# Patient Record
Sex: Female | Born: 1935 | Race: Black or African American | Hispanic: No | Smoking: Never smoker
Health system: Southern US, Community
[De-identification: ages and names within clinical notes are randomized; demographics above are authoritative.]

## PROBLEM LIST (undated history)

## (undated) DIAGNOSIS — I509 Heart failure, unspecified: Secondary | ICD-10-CM

## (undated) DIAGNOSIS — Z9289 Personal history of other medical treatment: Secondary | ICD-10-CM

## (undated) DIAGNOSIS — I2510 Atherosclerotic heart disease of native coronary artery without angina pectoris: Secondary | ICD-10-CM

## (undated) DIAGNOSIS — F329 Major depressive disorder, single episode, unspecified: Secondary | ICD-10-CM

## (undated) DIAGNOSIS — J449 Chronic obstructive pulmonary disease, unspecified: Secondary | ICD-10-CM

## (undated) DIAGNOSIS — C50912 Malignant neoplasm of unspecified site of left female breast: Secondary | ICD-10-CM

## (undated) DIAGNOSIS — T8859XA Other complications of anesthesia, initial encounter: Secondary | ICD-10-CM

## (undated) DIAGNOSIS — C189 Malignant neoplasm of colon, unspecified: Secondary | ICD-10-CM

## (undated) DIAGNOSIS — F32A Depression, unspecified: Secondary | ICD-10-CM

## (undated) DIAGNOSIS — I10 Essential (primary) hypertension: Secondary | ICD-10-CM

## (undated) DIAGNOSIS — J45909 Unspecified asthma, uncomplicated: Secondary | ICD-10-CM

## (undated) DIAGNOSIS — J189 Pneumonia, unspecified organism: Secondary | ICD-10-CM

## (undated) DIAGNOSIS — J42 Unspecified chronic bronchitis: Secondary | ICD-10-CM

## (undated) DIAGNOSIS — G4733 Obstructive sleep apnea (adult) (pediatric): Secondary | ICD-10-CM

## (undated) DIAGNOSIS — D573 Sickle-cell trait: Secondary | ICD-10-CM

## (undated) DIAGNOSIS — I272 Pulmonary hypertension, unspecified: Secondary | ICD-10-CM

## (undated) DIAGNOSIS — T4145XA Adverse effect of unspecified anesthetic, initial encounter: Secondary | ICD-10-CM

## (undated) DIAGNOSIS — E7800 Pure hypercholesterolemia, unspecified: Secondary | ICD-10-CM

## (undated) DIAGNOSIS — F419 Anxiety disorder, unspecified: Secondary | ICD-10-CM

## (undated) DIAGNOSIS — E119 Type 2 diabetes mellitus without complications: Secondary | ICD-10-CM

## (undated) DIAGNOSIS — K219 Gastro-esophageal reflux disease without esophagitis: Secondary | ICD-10-CM

## (undated) DIAGNOSIS — N183 Chronic kidney disease, stage 3 unspecified: Secondary | ICD-10-CM

## (undated) DIAGNOSIS — Z9981 Dependence on supplemental oxygen: Secondary | ICD-10-CM

## (undated) DIAGNOSIS — I739 Peripheral vascular disease, unspecified: Secondary | ICD-10-CM

## (undated) DIAGNOSIS — M1990 Unspecified osteoarthritis, unspecified site: Secondary | ICD-10-CM

## (undated) HISTORY — PX: ABDOMINAL HYSTERECTOMY: SHX81

## (undated) HISTORY — PX: HERNIA REPAIR: SHX51

## (undated) HISTORY — PX: MASTECTOMY: SHX3

## (undated) HISTORY — PX: BREAST BIOPSY: SHX20

## (undated) HISTORY — PX: UMBILICAL HERNIA REPAIR: SHX196

## (undated) HISTORY — PX: COLECTOMY: SHX59

---

## 2013-05-19 HISTORY — PX: CORONARY ANGIOPLASTY WITH STENT PLACEMENT: SHX49

## 2013-05-19 HISTORY — PX: CORONARY ARTERY BYPASS GRAFT: SHX141

## 2013-06-19 DIAGNOSIS — J189 Pneumonia, unspecified organism: Secondary | ICD-10-CM

## 2013-06-19 DIAGNOSIS — F419 Anxiety disorder, unspecified: Secondary | ICD-10-CM

## 2013-06-19 HISTORY — DX: Anxiety disorder, unspecified: F41.9

## 2013-06-19 HISTORY — DX: Pneumonia, unspecified organism: J18.9

## 2013-06-26 ENCOUNTER — Inpatient Hospital Stay
Admission: AD | Admit: 2013-06-26 | Discharge: 2013-08-07 | Disposition: A | Discharge status: Skilled Nursing Facility | Payer: Self-pay | Source: Ambulatory Visit | Attending: Internal Medicine | Admitting: Internal Medicine

## 2013-06-26 DIAGNOSIS — G4733 Obstructive sleep apnea (adult) (pediatric): Secondary | ICD-10-CM

## 2013-06-26 DIAGNOSIS — R6521 Severe sepsis with septic shock: Secondary | ICD-10-CM

## 2013-06-26 DIAGNOSIS — D649 Anemia, unspecified: Secondary | ICD-10-CM

## 2013-06-26 DIAGNOSIS — K920 Hematemesis: Secondary | ICD-10-CM

## 2013-06-26 DIAGNOSIS — Z9911 Dependence on respirator [ventilator] status: Secondary | ICD-10-CM

## 2013-06-26 DIAGNOSIS — R195 Other fecal abnormalities: Secondary | ICD-10-CM

## 2013-06-26 DIAGNOSIS — E662 Morbid (severe) obesity with alveolar hypoventilation: Secondary | ICD-10-CM

## 2013-06-26 DIAGNOSIS — A419 Sepsis, unspecified organism: Secondary | ICD-10-CM

## 2013-06-26 DIAGNOSIS — J90 Pleural effusion, not elsewhere classified: Secondary | ICD-10-CM

## 2013-06-26 DIAGNOSIS — Z930 Tracheostomy status: Secondary | ICD-10-CM

## 2013-06-26 DIAGNOSIS — J9620 Acute and chronic respiratory failure, unspecified whether with hypoxia or hypercapnia: Secondary | ICD-10-CM

## 2013-06-26 HISTORY — DX: Pulmonary hypertension, unspecified: I27.20

## 2013-06-26 HISTORY — DX: Atherosclerotic heart disease of native coronary artery without angina pectoris: I25.10

## 2013-06-26 HISTORY — DX: Chronic obstructive pulmonary disease, unspecified: J44.9

## 2013-06-26 HISTORY — DX: Essential (primary) hypertension: I10

## 2013-06-26 HISTORY — DX: Heart failure, unspecified: I50.9

## 2013-06-26 HISTORY — DX: Peripheral vascular disease, unspecified: I73.9

## 2013-06-26 LAB — BLOOD GAS, ARTERIAL
ACID-BASE EXCESS: 7.5 mmol/L — AB (ref 0.0–2.0)
BICARBONATE: 33.1 meq/L — AB (ref 20.0–24.0)
DELIVERY SYSTEMS: POSITIVE
Expiratory PAP: 10
FIO2: 0.5 %
Inspiratory PAP: 18
O2 Saturation: 98.6 %
PATIENT TEMPERATURE: 98.6
TCO2: 35 mmol/L (ref 0–100)
pCO2 arterial: 62.1 mmHg (ref 35.0–45.0)
pH, Arterial: 7.346 — ABNORMAL LOW (ref 7.350–7.450)
pO2, Arterial: 103 mmHg — ABNORMAL HIGH (ref 80.0–100.0)

## 2013-06-27 ENCOUNTER — Other Ambulatory Visit (HOSPITAL_COMMUNITY): Payer: Self-pay

## 2013-06-27 DIAGNOSIS — E662 Morbid (severe) obesity with alveolar hypoventilation: Secondary | ICD-10-CM | POA: Diagnosis present

## 2013-06-27 DIAGNOSIS — G4733 Obstructive sleep apnea (adult) (pediatric): Secondary | ICD-10-CM | POA: Diagnosis present

## 2013-06-27 DIAGNOSIS — Z9911 Dependence on respirator [ventilator] status: Secondary | ICD-10-CM

## 2013-06-27 DIAGNOSIS — J9620 Acute and chronic respiratory failure, unspecified whether with hypoxia or hypercapnia: Secondary | ICD-10-CM | POA: Diagnosis present

## 2013-06-27 LAB — URINALYSIS, ROUTINE W REFLEX MICROSCOPIC
BILIRUBIN URINE: NEGATIVE
GLUCOSE, UA: NEGATIVE mg/dL
KETONES UR: NEGATIVE mg/dL
Nitrite: NEGATIVE
PH: 5 (ref 5.0–8.0)
Protein, ur: NEGATIVE mg/dL
SPECIFIC GRAVITY, URINE: 1.013 (ref 1.005–1.030)
Urobilinogen, UA: 0.2 mg/dL (ref 0.0–1.0)

## 2013-06-27 LAB — COMPREHENSIVE METABOLIC PANEL
ALK PHOS: 92 U/L (ref 39–117)
ALT: 10 U/L (ref 0–35)
AST: 15 U/L (ref 0–37)
Albumin: 2.5 g/dL — ABNORMAL LOW (ref 3.5–5.2)
BUN: 17 mg/dL (ref 6–23)
CO2: 36 meq/L — AB (ref 19–32)
Calcium: 9.7 mg/dL (ref 8.4–10.5)
Chloride: 108 mEq/L (ref 96–112)
Creatinine, Ser: 1.19 mg/dL — ABNORMAL HIGH (ref 0.50–1.10)
GFR calc Af Amer: 50 mL/min — ABNORMAL LOW (ref >90)
GFR, EST NON AFRICAN AMERICAN: 44 mL/min — AB (ref >90)
GLUCOSE: 155 mg/dL — AB (ref 70–99)
POTASSIUM: 4.9 meq/L (ref 3.7–5.3)
SODIUM: 151 meq/L — AB (ref 137–147)
Total Bilirubin: 0.5 mg/dL (ref 0.3–1.2)
Total Protein: 6.1 g/dL (ref 6.0–8.3)

## 2013-06-27 LAB — BLOOD GAS, ARTERIAL
ACID-BASE EXCESS: 8.4 mmol/L — AB (ref 0.0–2.0)
ACID-BASE EXCESS: 10 mmol/L — AB (ref 0.0–2.0)
Acid-Base Excess: 10.6 mmol/L — ABNORMAL HIGH (ref 0.0–2.0)
Bicarbonate: 34.3 mEq/L — ABNORMAL HIGH (ref 20.0–24.0)
Bicarbonate: 36.3 mEq/L — ABNORMAL HIGH (ref 20.0–24.0)
Bicarbonate: 34.5 mEq/L — ABNORMAL HIGH (ref 20.0–24.0)
DELIVERY SYSTEMS: POSITIVE
Expiratory PAP: 10
FIO2: 0.5 %
FIO2: 0.7 %
FIO2: 60 %
INSPIRATORY PAP: 18
LHR: 18 {breaths}/min
LHR: 22 {breaths}/min
MECHVT: 430 mL
O2 SAT: 98.1 %
O2 Saturation: 99.2 %
O2 Saturation: 100 %
PATIENT TEMPERATURE: 98.6
PCO2 ART: 44.1 mmHg (ref 35.0–45.0)
PEEP/CPAP: 5 cmH2O
PEEP/CPAP: 5 cmH2O
PH ART: 7.314 — AB (ref 7.350–7.450)
PIP: 0 cmH2O
Patient temperature: 98.6
Patient temperature: 98.6
Pressure control: 30 cmH2O
TCO2: 36.4 mmol/L (ref 0–100)
TCO2: 38.5 mmol/L (ref 0–100)
TCO2: 35.8 mmol/L (ref 0–100)
pCO2 arterial: 67.1 mmHg (ref 35.0–45.0)
pCO2 arterial: 73.6 mmHg (ref 35.0–45.0)
pH, Arterial: 7.329 — ABNORMAL LOW (ref 7.350–7.450)
pH, Arterial: 7.504 — ABNORMAL HIGH (ref 7.350–7.450)
pO2, Arterial: 104 mmHg — ABNORMAL HIGH (ref 80.0–100.0)
pO2, Arterial: 129 mmHg — ABNORMAL HIGH (ref 80.0–100.0)
pO2, Arterial: 212 mmHg — ABNORMAL HIGH (ref 80.0–100.0)

## 2013-06-27 LAB — CBC WITH DIFFERENTIAL/PLATELET
Basophils Absolute: 0 10*3/uL (ref 0.0–0.1)
Basophils Relative: 0 % (ref 0–1)
EOS PCT: 1 % (ref 0–5)
Eosinophils Absolute: 0.1 10*3/uL (ref 0.0–0.7)
HCT: 25.5 % — ABNORMAL LOW (ref 36.0–46.0)
Hemoglobin: 7.7 g/dL — ABNORMAL LOW (ref 12.0–15.0)
LYMPHS ABS: 0.7 10*3/uL (ref 0.7–4.0)
Lymphocytes Relative: 8 % — ABNORMAL LOW (ref 12–46)
MCH: 28.3 pg (ref 26.0–34.0)
MCHC: 30.2 g/dL (ref 30.0–36.0)
MCV: 93.8 fL (ref 78.0–100.0)
Monocytes Absolute: 0.8 10*3/uL (ref 0.1–1.0)
Monocytes Relative: 9 % (ref 3–12)
Neutro Abs: 7.3 10*3/uL (ref 1.7–7.7)
Neutrophils Relative %: 81 % — ABNORMAL HIGH (ref 43–77)
PLATELETS: 273 10*3/uL (ref 150–400)
RBC: 2.72 MIL/uL — AB (ref 3.87–5.11)
RDW: 14.8 % (ref 11.5–15.5)
WBC: 9 10*3/uL (ref 4.0–10.5)

## 2013-06-27 LAB — URINE MICROSCOPIC-ADD ON

## 2013-06-27 LAB — PREALBUMIN: Prealbumin: 9.8 mg/dL — ABNORMAL LOW (ref 17.0–34.0)

## 2013-06-27 LAB — PHOSPHORUS: Phosphorus: 2.8 mg/dL (ref 2.3–4.6)

## 2013-06-27 LAB — HEMOGLOBIN A1C
HEMOGLOBIN A1C: 5.9 % — AB (ref <5.7)
Mean Plasma Glucose: 123 mg/dL — ABNORMAL HIGH (ref <117)

## 2013-06-27 LAB — VITAMIN B12: Vitamin B-12: 358 pg/mL (ref 211–911)

## 2013-06-27 LAB — PRO B NATRIURETIC PEPTIDE: Pro B Natriuretic peptide (BNP): 2234 pg/mL — ABNORMAL HIGH (ref 0–450)

## 2013-06-27 LAB — MAGNESIUM: Magnesium: 2.3 mg/dL (ref 1.5–2.5)

## 2013-06-27 LAB — FOLATE RBC: RBC Folate: 996 ng/mL — ABNORMAL HIGH (ref >280)

## 2013-06-27 LAB — T4, FREE: FREE T4: 1.27 ng/dL (ref 0.80–1.80)

## 2013-06-27 LAB — TSH: TSH: 3.252 u[IU]/mL (ref 0.350–4.500)

## 2013-06-27 NOTE — Procedures (Signed)
Intubation Procedure Note Levenia Skalicky [id number] May 17, 1936  Procedure: Intubation Indications: Respiratory insufficiency  Procedure Details Consent: Risks of procedure as well as the alternatives and risks of each were explained to the (patient/caregiver).  Consent for procedure obtained. Time Out: Verified patient identification, verified procedure, site/side was marked, verified correct patient position, special equipment/implants available, medications/allergies/relevent history reviewed, required imaging and test results available.  Performed  Maximum sterile technique was used including gloves, hand hygiene and mask.  MAC and 3  Given 2 mg versed, 100 mcg fentanyl, 10 mg etomidate.  Inserted 7.5 ETT using glidescope.  Evaluation Hemodynamic Status: BP stable throughout; O2 sats: stable throughout Patient's Current Condition: stable Complications: No apparent complications Patient did tolerate procedure well. Chest X-ray ordered to verify placement.  CXR: pending.   Performed by Marni Griffon, ACNP.  I was present during procedure.  Chesley Mires, MD [facility] Pulmonary/Critical Care 06/27/2013, 12:36 PM Pager:  [phone] After 3pm call: [phone]

## 2013-06-27 NOTE — Procedures (Deleted)
Intubation Procedure Note Lori Burnett [id number] 1936/03/30  Procedure: Intubation Indications: Respiratory insufficiency  Procedure Details Consent: Risks of procedure as well as the alternatives and risks of each were explained to the (patient/caregiver).  Consent for procedure obtained. Time Out: Verified patient identification, verified procedure, site/side was marked, verified correct patient position, special equipment/implants available, medications/allergies/relevent history reviewed, required imaging and test results available.  Performed  Maximum sterile technique was used including antiseptics, cap, gloves, hand hygiene and mask.  MAC 3 glide scope     Evaluation Hemodynamic Status: BP stable throughout; O2 sats: stable throughout Patient's Current Condition: stable Complications: No apparent complications Patient did tolerate procedure well. Chest X-ray ordered to verify placement.  CXR: pending.   BABCOCK,PETE 06/27/2013

## 2013-06-27 NOTE — Consult Note (Signed)
Name: Lori Burnett MRN: [id number] DOB: 03/25/1936    ADMISSION DATE:  06/26/2013 CONSULTATION DATE: 1/9  REFERRING MD :  Laren Everts PRIMARY SERVICE:  [facility] CHIEF COMPLAINT:  Respiratory failure   BRIEF PATIENT DESCRIPTION:  [facility] artery bypass grafting on 12/22. Postop course notable for difficult wean, atrial fibrillation,UTI progressive deconditioning, and almost continuous BiPAP dependence. Transferred to [facility] on 1/9 for further supportive care.  SIGNIFICANT EVENTS / STUDIES:  CABG 12/22   LINES / TUBES:   CULTURES:   ANTIBIOTICS: Levaquin 1/7>>>>  HISTORY OF PRESENT ILLNESS:   This is a Lori Burnett with a complex medical history of obesity, obstructive sleep apnea, congestive heart failure, chronic dyspnea, and heart disease, admitted to [facility] on 12/17 with acute MI. Ultimately underwent coronary artery bypass grafting x3 vessels on 12/22. Hospital course was prolonged, and complicated by postoperative anemia requiring blood transfusion, difficulty with ventilator weaning, urinary tract infection, and postop atrial fibrillation requiring amiodarone and Cardizem. She was eventually extubated, however post extubation course required almost continuous BiPAP support. With notes from outside hospital noting almost immediate desaturation when positive pressure was removed. [facility] in order to receive further rehabilitation efforts, and positive pressure support.  PAST MEDICAL HISTORY :  Heart disease, hypertension, chronic dyspnea, breast cancer, with prior left mastectomy, peripheral artery disease, history of pulmonary artery hypertension with cor pulmonale, congestive heart failure, RV dilation, obstructive sleep apnea, "COPD", colectomy, colon cancer, gouty  arthritis, diabetes type 2 with poor control, history of pulmonary edema. Acute diagnoses Profound deconditioning, acute on chronic respiratory failure, hypernatremia, diabetes, status post CABG, cor pulmonale, hypertension, pulmonary artery hypertension( secondary and pregnancy Prior to Admission medications   Lipitor 40 mg by mouth daily, Azopt 1% ophthalmic drops, ferrous sulfate 325 by mouth daily Niaspan 500 mg daily Norvasc 10 mg daily loratadine 10 mg daily Paxil 30 mg daily Singulair 10 mg daily DuoNeb 4 times a day, Plavix 75 mg daily Cordarone 200 mg twice a day Lopressor 50 mg twice a day Cardizem 30 mg 4 times a day Prinivil 5 mg daily Colace 100 mg twice a day Zantac 150 mg daily liver mirror Flexaphen 16 units each bedtime NovoLog sliding scale insulin TriCor 160 mg daily Pulmicort neb 0.5 mg twice a day Levaquin 500 mg by mouth every 24 hours started 1/7   Allergies not on file No known drug allergies FAMILY HISTORY:  No family history on file. SOCIAL HISTORY:  has no tobacco, alcohol, and drug history on file.  REVIEW OF SYSTEMS:   Unable   SUBJECTIVE:  Labored respiratory efforts on noninvasive support VITAL SIGNS:    PHYSICAL EXAMINATION: General:  Lori obese African American Burnett currently on iPAP Neuro:  Awake, oriented, profoundly weak HEENT:  Neck is large, unable to appreciate jugular venous distention Cardiovascular:  distant Lungs:  diminished Abdomen:  Obese and without organomegaly Musculoskeletal:  Intact Skin:  intact   Recent Labs Lab 06/27/13 0535  NA 151*  K 4.9  CL 108  CO2 36*  BUN 17  CREATININE 1.19*  GLUCOSE 155*    Recent Labs Lab 06/27/13 0535  HGB 7.7*  HCT 25.5*  WBC 9.0  PLT 273   Dg Chest Port 1 View  06/27/2013   CLINICAL DATA:  COPD, respiratory failure  EXAM: PORTABLE CHEST - 1 VIEW  COMPARISON:  None.  FINDINGS: Right IJ and right subclavian central venous catheters are present. The right IJ catheter terminates  in the mid SVC in the right subclavian catheter projects over the distal SVC. Enlargement of the cardiac and mediastinal contours favored to reflect a combination of cardiomegaly, vascular congestion and portable frontal technique. Patient is status post median sternotomy with evidence of multivessel CABG including LIMA bypass. There is pulmonary vascular congestion with a moderate pulmonary edema. Bilateral layering pleural effusions and associated bibasilar opacities favored to reflect pleural fluid with atelectasis.  IMPRESSION: 1. Overall findings are most consistent with moderate CHF. 2. Enlargement of the cardiopericardial silhouette may reflect cardiomegaly and/ pericardial effusion. 3. Bilateral interstitial and airspace opacities favored to reflect moderate pulmonary edema. 4. Bibasilar opacities likely reflecting layering pleural effusions and atelectasis. In the appropriate clinical setting, superimposed infection is difficult to exclude entirely. 5. Right IJ and subclavian central venous catheters with their tips projecting over the mid and lower SVC respectively.   Electronically Signed   By: Jacqulynn Cadet M.D.   On: 06/27/2013 07:59    ASSESSMENT / PLAN: Acute on chronic respiratory failure in the setting of what appears to be multifactorial: Obesity hypoventilation syndrome, obstructive sleep apnea, profound deconditioning, and atelectasis. Doubtful that she even has obstructive lung disease. She has been requiring some level of continuous positive pressure either via ventilator, or BiPAP, since 12/22. Her blood gas and physical exam suggests he is failing these efforts Recommendation -Intubation, mechanical ventilation -Most likely would benefit from tracheostomy -Following tracheostomy, reinitiate weaning efforts, currently appears like she would benefit from nocturnal ventilation even after acute issues have resolved  Reported history of cardiomyopathy, recent acute MI, and status  post CABG on 12/22. History of what is likely secondary pulmonary artery hypertension and cor pulmonale > No echocardiogram results available Atrial fibrillation hypertension Recommendation Would repeat echocardiogram Continue supportive medical care  Hypernatremia And CRI Recommendation plan Supplement free water, would hold diuresis today.  Urinary tract infection Plan Per IM service  Anemia of critical illness Plan Per IM service.  Diabetes Plan Per IM. Service  Profound deconditioning Plan PT OT efforts  [place] Pager: [phone]  06/27/2013, 10:50 AM  Reviewed above, examined pt, and agree with assessment/plan.  She has difficult course after recent CABG.  She has OSA/OHS and CHF.  She has been BiPAP dependent for past several days.  She will likely need intubation, and then tracheostomy with prolonged respiratory support.  CC time Lori minutes.  Chesley Mires, MD [facility] Pulmonary/Critical Care 06/27/2013, 12:03 PM Pager:  [phone] After 3pm call: [phone]

## 2013-06-28 ENCOUNTER — Other Ambulatory Visit (HOSPITAL_COMMUNITY): Payer: Self-pay

## 2013-06-28 LAB — CBC
HCT: 27.1 % — ABNORMAL LOW (ref 36.0–46.0)
HEMOGLOBIN: 8.4 g/dL — AB (ref 12.0–15.0)
MCH: 28.9 pg (ref 26.0–34.0)
MCHC: 31 g/dL (ref 30.0–36.0)
MCV: 93.1 fL (ref 78.0–100.0)
Platelets: 304 10*3/uL (ref 150–400)
RBC: 2.91 MIL/uL — ABNORMAL LOW (ref 3.87–5.11)
RDW: 14.8 % (ref 11.5–15.5)
WBC: 15.5 10*3/uL — ABNORMAL HIGH (ref 4.0–10.5)

## 2013-06-28 LAB — BASIC METABOLIC PANEL
BUN: 29 mg/dL — ABNORMAL HIGH (ref 6–23)
CHLORIDE: 103 meq/L (ref 96–112)
CO2: 33 mEq/L — ABNORMAL HIGH (ref 19–32)
CREATININE: 1.23 mg/dL — AB (ref 0.50–1.10)
Calcium: 9.5 mg/dL (ref 8.4–10.5)
GFR, EST AFRICAN AMERICAN: 48 mL/min — AB (ref >90)
GFR, EST NON AFRICAN AMERICAN: 42 mL/min — AB (ref >90)
Glucose, Bld: 247 mg/dL — ABNORMAL HIGH (ref 70–99)
POTASSIUM: 4.5 meq/L (ref 3.7–5.3)
Sodium: 150 mEq/L — ABNORMAL HIGH (ref 137–147)

## 2013-06-29 LAB — BASIC METABOLIC PANEL
BUN: 39 mg/dL — AB (ref 6–23)
CALCIUM: 8.9 mg/dL (ref 8.4–10.5)
CHLORIDE: 104 meq/L (ref 96–112)
CO2: 31 mEq/L (ref 19–32)
CREATININE: 1.76 mg/dL — AB (ref 0.50–1.10)
GFR calc Af Amer: 31 mL/min — ABNORMAL LOW (ref >90)
GFR, EST NON AFRICAN AMERICAN: 27 mL/min — AB (ref >90)
Glucose, Bld: 244 mg/dL — ABNORMAL HIGH (ref 70–99)
Potassium: 4.3 mEq/L (ref 3.7–5.3)
Sodium: 146 mEq/L (ref 137–147)

## 2013-06-29 LAB — URINE CULTURE

## 2013-06-29 LAB — CBC
HEMATOCRIT: 20.2 % — AB (ref 36.0–46.0)
Hemoglobin: 6.4 g/dL — CL (ref 12.0–15.0)
MCH: 28.8 pg (ref 26.0–34.0)
MCHC: 31.7 g/dL (ref 30.0–36.0)
MCV: 91 fL (ref 78.0–100.0)
PLATELETS: 250 10*3/uL (ref 150–400)
RBC: 2.22 MIL/uL — ABNORMAL LOW (ref 3.87–5.11)
RDW: 15.1 % (ref 11.5–15.5)
WBC: 13.7 10*3/uL — ABNORMAL HIGH (ref 4.0–10.5)

## 2013-06-29 LAB — ABO/RH: ABO/RH(D): O POS

## 2013-06-29 LAB — PREPARE RBC (CROSSMATCH)

## 2013-06-30 ENCOUNTER — Other Ambulatory Visit (HOSPITAL_COMMUNITY): Payer: Self-pay

## 2013-06-30 LAB — BLOOD GAS, ARTERIAL
ACID-BASE EXCESS: 9.7 mmol/L — AB (ref 0.0–2.0)
Bicarbonate: 33.8 mEq/L — ABNORMAL HIGH (ref 20.0–24.0)
FIO2: 0.4 %
LHR: 22 {breaths}/min
O2 Saturation: 99.4 %
PEEP: 5 cmH2O
PO2 ART: 131 mmHg — AB (ref 80.0–100.0)
Patient temperature: 98.6
TCO2: 35.3 mmol/L (ref 0–100)
VT: 430 mL
pCO2 arterial: 46.7 mmHg — ABNORMAL HIGH (ref 35.0–45.0)
pH, Arterial: 7.473 — ABNORMAL HIGH (ref 7.350–7.450)

## 2013-06-30 LAB — CBC
HCT: 26.5 % — ABNORMAL LOW (ref 36.0–46.0)
HEMOGLOBIN: 9 g/dL — AB (ref 12.0–15.0)
MCH: 29.3 pg (ref 26.0–34.0)
MCHC: 34 g/dL (ref 30.0–36.0)
MCV: 86.3 fL (ref 78.0–100.0)
Platelets: 211 10*3/uL (ref 150–400)
RBC: 3.07 MIL/uL — AB (ref 3.87–5.11)
RDW: 15.3 % (ref 11.5–15.5)
WBC: 12.4 10*3/uL — ABNORMAL HIGH (ref 4.0–10.5)

## 2013-06-30 LAB — BASIC METABOLIC PANEL
BUN: 41 mg/dL — ABNORMAL HIGH (ref 6–23)
CO2: 32 mEq/L (ref 19–32)
Calcium: 8.9 mg/dL (ref 8.4–10.5)
Chloride: 97 mEq/L (ref 96–112)
Creatinine, Ser: 1.72 mg/dL — ABNORMAL HIGH (ref 0.50–1.10)
GFR, EST AFRICAN AMERICAN: 32 mL/min — AB (ref >90)
GFR, EST NON AFRICAN AMERICAN: 28 mL/min — AB (ref >90)
Glucose, Bld: 205 mg/dL — ABNORMAL HIGH (ref 70–99)
POTASSIUM: 3.6 meq/L — AB (ref 3.7–5.3)
SODIUM: 142 meq/L (ref 137–147)

## 2013-06-30 NOTE — Consult Note (Signed)
   Name: Lori Burnett MRN: [id number] DOB: October 04, 1935    ADMISSION DATE:  06/26/2013 CONSULTATION DATE: 1/9  REFERRING MD :  Laren Everts PRIMARY SERVICE:  [facility] CHIEF COMPLAINT:  Respiratory failure   BRIEF PATIENT DESCRIPTION:  [name] [facility] post coronary artery bypass grafting on 12/22. Postop course notable for difficult wean, atrial fibrillation,UTI progressive deconditioning, and almost continuous BiPAP dependence. Transferred to [facility] on 1/9 for further supportive care.  SIGNIFICANT EVENTS / STUDIES:  CABG 12/22   LINES / TUBES:   CULTURES:   ANTIBIOTICS: Levaquin 1/7>>>>  SUBJECTIVE:  Sedated on vent   VITAL SIGNS:    PHYSICAL EXAMINATION: General:  Morbidly obese African American female currently sedated on vent  Neuro:  Awake, oriented, profoundly weak HEENT:  Neck is large, unable to appreciate jugular venous distention, short neck Cardiovascular:  distant Lungs:  diminished Abdomen:  Obese and without organomegaly Musculoskeletal:  Intact Skin:  intact   Recent Labs Lab 06/28/13 0438 06/29/13 0505 06/30/13 0305  NA 150* 146 142  K 4.5 4.3 3.6*  CL 103 104 97  CO2 33* 31 32  BUN 29* 39* 41*  CREATININE 1.23* 1.76* 1.72*  GLUCOSE 247* 244* 205*    Recent Labs Lab 06/28/13 0438 06/29/13 0505 06/30/13 0305  HGB 8.4* 6.4* 9.0*  HCT 27.1* 20.2* 26.5*  WBC 15.5* 13.7* 12.4*  PLT 304 250 211   Dg Chest Port 1 View  06/30/2013   CLINICAL DATA:  Respiratory failure.  EXAM: PORTABLE CHEST - 1 VIEW  COMPARISON:  06/28/2013  FINDINGS: Endotracheal tube tip is approximately 2.5 cm above the carina. Right-sided Port-A-Cath and central line positioning are stable. Lungs show similar edema pattern. Aeration at both lung bases is slightly improved. This may be the result of less prominent pleural effusions bilaterally. There is stable cardiomegaly.  IMPRESSION:  Stable edema. Improved aeration at the lung bases with potentially decrease in size of bilateral pleural effusions.   Electronically Signed   By: Aletta Edouard M.D.   On: 06/30/2013 07:50    ASSESSMENT / PLAN: Acute on chronic respiratory failure in the setting of what appears to be multifactorial: Obesity hypoventilation syndrome, obstructive sleep apnea, profound deconditioning, and atelectasis. Doubtful that she even has obstructive lung disease. Intubated 1/9. Continue full vent support.   Recommendation -cont  mechanical ventilation -needs ENT consult for trach, not a bedside candidate -Following tracheostomy, reinitiate weaning efforts, currently appears like she would benefit from nocturnal ventilation even after acute issues have resolved -abg now, ensure not alkaotic still and would need reduction MV PS attempts high  Reported history of cardiomyopathy, recent acute MI, and status post CABG on 12/22. History of what is likely secondary pulmonary artery hypertension and cor pulmonale > No echocardiogram results available Atrial fibrillation hypertension Recommendation Would repeat echocardiogram Continue supportive medical care  Hypernatremia And CRI Recommendation plan Supplement free water, would hold diuresis today.   [place] Pager: [phone]  06/30/2013, 1:46 PM  Ccm time 30 min   Lavon Paganini. Titus Mould, MD, [place] Pgr: [place] Pulmonary & Critical Care

## 2013-07-01 LAB — CULTURE, RESPIRATORY

## 2013-07-01 LAB — CULTURE, RESPIRATORY W GRAM STAIN

## 2013-07-01 LAB — VANCOMYCIN, TROUGH: Vancomycin Tr: 7.7 ug/mL — ABNORMAL LOW (ref 10.0–20.0)

## 2013-07-02 ENCOUNTER — Other Ambulatory Visit (HOSPITAL_COMMUNITY): Payer: Self-pay

## 2013-07-02 LAB — TYPE AND SCREEN
ABO/RH(D): O POS
Antibody Screen: NEGATIVE
UNIT DIVISION: 0
UNIT DIVISION: 0
Unit division: 0
Unit division: 0

## 2013-07-02 LAB — CBC
HCT: 23.3 % — ABNORMAL LOW (ref 36.0–46.0)
HEMOGLOBIN: 7.6 g/dL — AB (ref 12.0–15.0)
MCH: 28.7 pg (ref 26.0–34.0)
MCHC: 32.6 g/dL (ref 30.0–36.0)
MCV: 87.9 fL (ref 78.0–100.0)
PLATELETS: 222 10*3/uL (ref 150–400)
RBC: 2.65 MIL/uL — AB (ref 3.87–5.11)
RDW: 15.3 % (ref 11.5–15.5)
WBC: 11.6 10*3/uL — ABNORMAL HIGH (ref 4.0–10.5)

## 2013-07-02 LAB — BASIC METABOLIC PANEL
BUN: 46 mg/dL — ABNORMAL HIGH (ref 6–23)
CALCIUM: 8.8 mg/dL (ref 8.4–10.5)
CHLORIDE: 94 meq/L — AB (ref 96–112)
CO2: 34 meq/L — AB (ref 19–32)
Creatinine, Ser: 1.5 mg/dL — ABNORMAL HIGH (ref 0.50–1.10)
GFR calc Af Amer: 38 mL/min — ABNORMAL LOW (ref >90)
GFR calc non Af Amer: 33 mL/min — ABNORMAL LOW (ref >90)
GLUCOSE: 214 mg/dL — AB (ref 70–99)
Potassium: 3.7 mEq/L (ref 3.7–5.3)
Sodium: 140 mEq/L (ref 137–147)

## 2013-07-02 LAB — PRO B NATRIURETIC PEPTIDE: PRO B NATRI PEPTIDE: 1139 pg/mL — AB (ref 0–450)

## 2013-07-02 LAB — PROTIME-INR
INR: 1.21 (ref 0.00–1.49)
Prothrombin Time: 15 seconds (ref 11.6–15.2)

## 2013-07-02 LAB — APTT: aPTT: 42 seconds — ABNORMAL HIGH (ref 24–37)

## 2013-07-02 LAB — PREPARE RBC (CROSSMATCH)

## 2013-07-02 NOTE — H&P (Signed)
PREOPERATIVE H&P  Chief Complaint: respiratory failure  HPI: Lori Burnett is a 78 y.o. female who presents for evaluation of respiratory failure. She has a history of COPD, OSA and CHF. She underwent CABG x 3 in December and post operatively she had a lot of respiratory difficulty requiring intubation a week ago. She's expected to have a prolonged weaning and extubation course and was recommended tracheostomy but is not a candidate for bedside tracheostomy. I was subsequently consulted for tracheostomy in the OR. She's obese and has significant OSA.  No past medical history on file. No past surgical history on file. History   Social History  . Marital Status: Divorced    Spouse Name: N/A    Number of Children: N/A  . Years of Education: N/A   Social History Main Topics  . Smoking status: Not on file  . Smokeless tobacco: Not on file  . Alcohol Use: Not on file  . Drug Use: Not on file  . Sexual Activity: Not on file   Other Topics Concern  . Not on file   Social History Narrative  . No narrative on file   No family history on file. Allergies not on file Prior to Admission medications   Medication Sig Start Date End Date Taking? Authorizing Provider  atenolol (TENORMIN) 100 MG tablet Take 100 mg by mouth daily.   Yes Historical Provider, MD  atorvastatin (LIPITOR) 20 MG tablet Take 20 mg by mouth daily.   Yes Historical Provider, MD  Choline Fenofibrate 135 MG capsule Take 135 mg by mouth daily.   Yes Historical Provider, MD  cloNIDine (CATAPRES) 0.1 MG tablet Take 0.1 mg by mouth.   Yes Historical Provider, MD  fluticasone (FLONASE) 50 MCG/ACT nasal spray Place into both nostrils daily.   Yes Historical Provider, MD  insulin aspart (NOVOLOG) 100 UNIT/ML injection Inject into the skin 3 (three) times daily before meals.   Yes Historical Provider, MD  insulin glargine (LANTUS) 100 UNIT/ML injection Inject into the skin at bedtime.   Yes Historical Provider, MD  montelukast  (SINGULAIR) 10 MG tablet Take 10 mg by mouth at bedtime.   Yes Historical Provider, MD  nitroGLYCERIN (NITROSTAT) 0.4 MG SL tablet Place 0.4 mg under the tongue every 5 (five) minutes as needed for chest pain.   Yes Historical Provider, MD  omeprazole (PRILOSEC) 20 MG capsule Take 20 mg by mouth daily.   Yes Historical Provider, MD  PARoxetine (PAXIL) 40 MG tablet Take 40 mg by mouth every morning.   Yes Historical Provider, MD  potassium chloride SA (K-DUR,KLOR-CON) 20 MEQ tablet Take 20 mEq by mouth daily.   Yes Historical Provider, MD     Positive ROS: negative  All other systems have been reviewed and were otherwise negative with the exception of those mentioned in the HPI and as above.  Physical Exam: There were no vitals filed for this visit.  General: Intubated and sedated. Oral: Intubated with G tube. Nasal: Clear nasal passages Neck: No palpable adenopathy or thyroid nodules. Trach midline. Ear: Ear canal is clear with normal appearing TMs Cardiovascular: Regular rate and rhythm, no murmur. Vertical chest incision. Respiratory: Clear to auscultation Neurologic: Alert and oriented x 3   Assessment/Plan: respiratory failure Plan for Procedure(s): TRACHEOSTOMY   Melony Overly, MD 07/02/2013 10:15 AM

## 2013-07-02 NOTE — Consult Note (Signed)
NAME:  Lori Burnett, Lori Burnett                      ACCOUNT NO.:  MEDICAL RECORD NO.:  LOCATION:                                 FACILITY:  PHYSICIAN:  Jaelani Posa E. Lucia Gaskins, M.D. DATE OF BIRTH:  DATE OF CONSULTATION:  07/01/2013 DATE OF DISCHARGE:                                CONSULTATION   REASON FOR CONSULT:  Evaluate patient for tracheotomy.  BRIEF HISTORY:  Yamaira Burnett is an obese 78 year old female, who recently underwent coronary artery bypass surgery x3 on December 22nd, because of abnormalities found on the stress test and catheterization.  She has a long history of obstructive sleep apnea and congestive heart failure as well as COPD.  Postoperatively, she had a lot of breathing difficulty, requiring BiPAP.  She was subsequently transferred to specialty hospital last week and intubated because of her breathing difficulty.  Pulmonary specialty care feels like she is going to have a long course for extubation as she has bad obstructive sleep apnea as well as COPD, is very obese.  She was not a candidate for bedside tracheostomy, and I was consulted for tracheostomy in the OR.  PHYSICAL EXAMINATION:  The patient is intubated.  She is very obese, has a short neck.  Trachea is palpable in midline with no real masses.  I discussed the tracheostomy with Lori Burnett's daughters and they are agreeable to this.  We will plan on scheduling this later in the week.          ______________________________ Leonides Sake. Lucia Gaskins, M.D.     CEN/MEDQ  D:  07/01/2013  T:  07/02/2013  Job:  [id number]

## 2013-07-02 NOTE — Progress Notes (Signed)
    Name: Lori Burnett MRN: [id number] DOB: 1936-05-20    ADMISSION DATE:  06/26/2013 CONSULTATION DATE: 1/9  REFERRING MD :  Laren Everts PRIMARY SERVICE:  [facility] CHIEF COMPLAINT:  Respiratory failure   BRIEF PATIENT DESCRIPTION:  [name] [facility] post coronary artery bypass grafting on 12/22. Postop course notable for difficult wean, atrial fibrillation,UTI progressive deconditioning, and almost continuous BiPAP dependence. Transferred to [facility] on 1/9 for further supportive care.  SIGNIFICANT EVENTS / STUDIES:  CABG 12/22  LINES / TUBES: oett 1/10>>>  CULTURES:  ANTIBIOTICS: Levaquin 1/7>>>>  SUBJECTIVE:  Sedated on vent   VITAL SIGNS: reviewed, see chart    PHYSICAL EXAMINATION: General:  Morbidly obese African American female currently sedated on vent  Neuro:  Awake, oriented, profoundly weak HEENT:  Neck is large, unable to appreciate jugular venous distention, short neck Cardiovascular:  distant Lungs:  diminished Abdomen:  Obese and without organomegaly Musculoskeletal:  Intact Skin:  intact   Recent Labs Lab 06/29/13 0505 06/30/13 0305 07/02/13 0528  NA 146 142 140  K 4.3 3.6* 3.7  CL 104 97 94*  CO2 31 32 34*  BUN 39* 41* 46*  CREATININE 1.76* 1.72* 1.50*  GLUCOSE 244* 205* 214*    Recent Labs Lab 06/29/13 0505 06/30/13 0305 07/02/13 0528  HGB 6.4* 9.0* 7.6*  HCT 20.2* 26.5* 23.3*  WBC 13.7* 12.4* 11.6*  PLT 250 211 222   No results found.  ASSESSMENT / PLAN: Acute on chronic respiratory failure in the setting of what appears to be multifactorial: Obesity hypoventilation syndrome, obstructive sleep apnea, profound deconditioning, and atelectasis.  Doubtful that she even has obstructive lung disease. Intubated 1/9. Continue full vent support.  Trach planned for 1/15 Recommendation -cont  mechanical ventilation -needs ENT consult for trach, not a  bedside candidate -Following tracheostomy, reinitiate weaning efforts, currently appears like she would benefit from nocturnal ventilation even after acute issues have resolved PS attempts to 12 Would repeat pcxr  Reported history of cardiomyopathy, recent acute MI, and status post CABG on 12/22. History of what is likely secondary pulmonary artery hypertension and cor pulmonale > No echocardiogram results available Atrial fibrillation hypertension Recommendation Would repeat echocardiogram Continue supportive medical care  Hypernatremia And CRI Recommendation plan Supplement free water, would hold diuresis today again . Renal fxn improving  Anemia. hgb variable.  Plan: Per # service   [employer] Pager: [phone]  07/02/2013, 10:23 AM  I have fully examined this patient and agree with above findings.    And edited infull  Lavon Paganini. Titus Mould, MD, [place] Pgr: [place] [facility] & Critical [facility]

## 2013-07-03 ENCOUNTER — Encounter: Payer: Self-pay | Admitting: Anesthesiology

## 2013-07-03 ENCOUNTER — Encounter
Admission: AD | Disposition: A | Discharge status: Skilled Nursing Facility | Payer: Self-pay | Source: Ambulatory Visit | Attending: Internal Medicine

## 2013-07-03 ENCOUNTER — Encounter (HOSPITAL_COMMUNITY): Payer: Self-pay | Admitting: Anesthesiology

## 2013-07-03 ENCOUNTER — Ambulatory Visit (HOSPITAL_COMMUNITY): Payer: Self-pay | Admitting: Anesthesiology

## 2013-07-03 HISTORY — PX: TRACHEOSTOMY TUBE PLACEMENT: SHX814

## 2013-07-03 LAB — CBC WITH DIFFERENTIAL/PLATELET
Basophils Absolute: 0 10*3/uL (ref 0.0–0.1)
Basophils Relative: 0 % (ref 0–1)
Eosinophils Absolute: 0.7 10*3/uL (ref 0.0–0.7)
Eosinophils Relative: 6 % — ABNORMAL HIGH (ref 0–5)
HCT: 26.6 % — ABNORMAL LOW (ref 36.0–46.0)
HEMOGLOBIN: 8.8 g/dL — AB (ref 12.0–15.0)
LYMPHS PCT: 7 % — AB (ref 12–46)
Lymphs Abs: 0.8 10*3/uL (ref 0.7–4.0)
MCH: 28.3 pg (ref 26.0–34.0)
MCHC: 33.1 g/dL (ref 30.0–36.0)
MCV: 85.5 fL (ref 78.0–100.0)
MONOS PCT: 13 % — AB (ref 3–12)
Monocytes Absolute: 1.5 10*3/uL — ABNORMAL HIGH (ref 0.1–1.0)
NEUTROS ABS: 8.5 10*3/uL — AB (ref 1.7–7.7)
Neutrophils Relative %: 74 % (ref 43–77)
Platelets: 213 10*3/uL (ref 150–400)
RBC: 3.11 MIL/uL — ABNORMAL LOW (ref 3.87–5.11)
RDW: 15.6 % — ABNORMAL HIGH (ref 11.5–15.5)
WBC: 11.4 10*3/uL — AB (ref 4.0–10.5)

## 2013-07-03 LAB — OCCULT BLOOD X 1 CARD TO LAB, STOOL: Fecal Occult Bld: POSITIVE — AB

## 2013-07-03 SURGERY — CREATION, TRACHEOSTOMY
Anesthesia: General | Site: Neck

## 2013-07-03 MED ORDER — NOREPINEPHRINE BITARTRATE 1 MG/ML IJ SOLN
4000.0000 ug | INTRAVENOUS | Status: DC | PRN
  Administered 2013-07-03: 15.4 ug/min via INTRAVENOUS

## 2013-07-03 MED ORDER — LACTATED RINGERS IV SOLN
INTRAVENOUS | Status: DC | PRN
  Administered 2013-07-03: 08:00:00 via INTRAVENOUS

## 2013-07-03 MED ORDER — LIDOCAINE-EPINEPHRINE 1 %-1:100000 IJ SOLN
INTRAMUSCULAR | Status: DC | PRN
  Administered 2013-07-03: 20 mL

## 2013-07-03 MED ORDER — LIDOCAINE HCL 4 % MT SOLN
OROMUCOSAL | Status: DC | PRN
  Administered 2013-07-03: 4 mL via TOPICAL

## 2013-07-03 MED ORDER — 0.9 % SODIUM CHLORIDE (POUR BTL) OPTIME
TOPICAL | Status: DC | PRN
  Administered 2013-07-03: 1000 mL

## 2013-07-03 MED ORDER — PROPRANOLOL HCL 1 MG/ML IV SOLN
INTRAVENOUS | Status: DC | PRN
  Administered 2013-07-03 (×2): .25 mg via INTRAVENOUS

## 2013-07-03 MED ORDER — ROCURONIUM BROMIDE 100 MG/10ML IV SOLN
INTRAVENOUS | Status: DC | PRN
  Administered 2013-07-03: 20 mg via INTRAVENOUS

## 2013-07-03 MED ORDER — PROPOFOL INFUSION 10 MG/ML OPTIME
INTRAVENOUS | Status: DC | PRN
  Administered 2013-07-03: 5 ug/kg/min via INTRAVENOUS

## 2013-07-03 MED ORDER — FENTANYL CITRATE 0.05 MG/ML IJ SOLN
INTRAMUSCULAR | Status: DC | PRN
  Administered 2013-07-03 (×3): 50 ug via INTRAVENOUS
  Administered 2013-07-03: 100 ug via INTRAVENOUS

## 2013-07-03 SURGICAL SUPPLY — 43 items
ATTRACTOMAT 16X20 MAGNETIC DRP (DRAPES) IMPLANT
BLADE SURG 15 STRL LF DISP TIS (BLADE) ×1 IMPLANT
BLADE SURG 15 STRL SS (BLADE) ×2
CLEANER TIP ELECTROSURG 2X2 (MISCELLANEOUS) ×3 IMPLANT
CLOTH BEACON ORANGE TIMEOUT ST (SAFETY) ×3 IMPLANT
COVER SURGICAL LIGHT HANDLE (MISCELLANEOUS) ×3 IMPLANT
ELECT COATED BLADE 2.86 ST (ELECTRODE) ×3 IMPLANT
ELECT REM PT RETURN 9FT ADLT (ELECTROSURGICAL) ×3
ELECTRODE REM PT RTRN 9FT ADLT (ELECTROSURGICAL) ×1 IMPLANT
GAUZE SPONGE 4X4 16PLY XRAY LF (GAUZE/BANDAGES/DRESSINGS) ×3 IMPLANT
GLOVE BIOGEL PI IND STRL 7.0 (GLOVE) ×1 IMPLANT
GLOVE BIOGEL PI INDICATOR 7.0 (GLOVE) ×2
GLOVE SS BIOGEL STRL SZ 7.5 (GLOVE) ×2 IMPLANT
GLOVE SUPERSENSE BIOGEL SZ 7.5 (GLOVE) ×4
GLOVE SURG SS PI 7.0 STRL IVOR (GLOVE) ×3 IMPLANT
GOWN PREVENTION PLUS XLARGE (GOWN DISPOSABLE) ×3 IMPLANT
GOWN STRL NON-REIN LRG LVL3 (GOWN DISPOSABLE) ×3 IMPLANT
HOLDER TRACH TUBE VELCRO 19.5 (MISCELLANEOUS) ×3 IMPLANT
KIT BASIN OR (CUSTOM PROCEDURE TRAY) ×3 IMPLANT
KIT ROOM TURNOVER OR (KITS) ×3 IMPLANT
KIT SUCTION CATH 14FR (SUCTIONS) ×3 IMPLANT
NEEDLE HYPO 25X1 1.5 SAFETY (NEEDLE) ×3 IMPLANT
NS IRRIG 1000ML POUR BTL (IV SOLUTION) ×3 IMPLANT
PACK EENT II TURBAN DRAPE (CUSTOM PROCEDURE TRAY) ×3 IMPLANT
PAD ARMBOARD 7.5X6 YLW CONV (MISCELLANEOUS) ×6 IMPLANT
PENCIL BUTTON HOLSTER BLD 10FT (ELECTRODE) ×3 IMPLANT
SPONGE DRAIN TRACH 4X4 STRL 2S (GAUZE/BANDAGES/DRESSINGS) ×3 IMPLANT
SPONGE GAUZE 4X4 12PLY STER LF (GAUZE/BANDAGES/DRESSINGS) ×3 IMPLANT
SPONGE INTESTINAL PEANUT (DISPOSABLE) IMPLANT
SURGILUBE 2OZ TUBE FLIPTOP (MISCELLANEOUS) ×3 IMPLANT
SUT SILK 2 0 SH CR/8 (SUTURE) ×3 IMPLANT
SUT SILK 3 0 TIES 10X30 (SUTURE) IMPLANT
SYR 20CC LL (SYRINGE) ×3 IMPLANT
SYR CONTROL 10ML LL (SYRINGE) ×3 IMPLANT
TOWEL OR 17X24 6PK STRL BLUE (TOWEL DISPOSABLE) ×6 IMPLANT
TOWEL OR 17X26 10 PK STRL BLUE (TOWEL DISPOSABLE) ×3 IMPLANT
TUBE CONNECTING 12'X1/4 (SUCTIONS) ×1
TUBE CONNECTING 12X1/4 (SUCTIONS) ×2 IMPLANT
TUBE TRACH 6.0 EXL PROX CUF (TUBING) ×3 IMPLANT
TUBE TRACH SHILEY  6 DIST  CUF (TUBING) IMPLANT
TUBE TRACH SHILEY 10 DIST CUFF (TUBING) IMPLANT
TUBE TRACH SHILEY 8 DIST CUF (TUBING) IMPLANT
WATER STERILE IRR 1000ML POUR (IV SOLUTION) ×3 IMPLANT

## 2013-07-03 NOTE — Transfer of Care (Signed)
Immediate Anesthesia Transfer of Care Note  Patient: Lori Burnett  Procedure(s) Performed: Procedure(s): TRACHEOSTOMY (N/A)  Patient Location: Nursing Unit  Anesthesia Type:General  Level of Consciousness: Patient remains intubated per anesthesia plan  Airway & Oxygen Therapy: Patient remains intubated per anesthesia plan and Patient placed on Ventilator (see vital sign flow sheet for setting)  Post-op Assessment: Report given to PACU RN  Post vital signs: Reviewed and stable  Complications: No apparent anesthesia complications

## 2013-07-03 NOTE — Anesthesia Postprocedure Evaluation (Signed)
  Anesthesia Post-op Note  Patient: Lori Burnett  Procedure(s) Performed: Procedure(s): TRACHEOSTOMY (N/A)  Patient Location: Nursing Unit  Anesthesia Type:General  Level of Consciousness: Patient remains intubated per anesthesia plan  Airway and Oxygen Therapy: Patient placed on Ventilator (see vital sign flow sheet for setting) and connected to trachostomy  Post-op Pain: none  Post-op Assessment: Post-op Vital signs reviewed  Post-op Vital Signs: Reviewed and stable  Complications: No apparent anesthesia complications

## 2013-07-03 NOTE — Brief Op Note (Signed)
06/26/2013 - 07/03/2013  8:50 AM  PATIENT:  Lori Burnett  78 y.o. female  PRE-OPERATIVE DIAGNOSIS:  Respiratory failure  POST-OPERATIVE DIAGNOSIS:  Respiratory failure  PROCEDURE:  Procedure(s): TRACHEOSTOMY (N/A) Shiley 6 XLTCP  SURGEON:  Surgeon(s) and Role:    * Rozetta Nunnery, MD - Primary  PHYSICIAN ASSISTANT:   ASSISTANTS: none   ANESTHESIA:   general  EBL:     BLOOD ADMINISTERED:none  DRAINS: none   LOCAL MEDICATIONS USED:  XYLOCAINE with EPI 4cc  SPECIMEN:  No Specimen  DISPOSITION OF SPECIMEN:  N/A  COUNTS:  YES  TOURNIQUET:  * No tourniquets in log *  DICTATION: Viviann Spare Dictation Dictation [phone]  PLAN OF CARE: Discharge to home after PACU  PATIENT DISPOSITION:  PACU - hemodynamically stable.   Delay start of Pharmacological VTE agent (>24hrs) due to surgical blood loss or risk of bleeding: not applicable

## 2013-07-03 NOTE — Anesthesia Postprocedure Evaluation (Signed)
  Anesthesia Post-op Note  Patient: Lori Burnett  Procedure(s) Performed: Procedure(s): TRACHEOSTOMY (N/A)  Patient Location: ICU  Anesthesia Type:General  Level of Consciousness: sedated, patient cooperative and Patient remains intubated per anesthesia plan  Airway and Oxygen Therapy: Patient Spontanous Breathing  Post-op Pain: none  Post-op Assessment: Post-op Vital signs reviewed, Patient's Cardiovascular Status Stable, Respiratory Function Stable, Patent Airway, No signs of Nausea or vomiting and Pain level controlled  Post-op Vital Signs: stable  Complications: No apparent anesthesia complications

## 2013-07-03 NOTE — Anesthesia Preprocedure Evaluation (Signed)
Anesthesia Evaluation  Patient identified by MRN, date of birth, ID band Patient awake    Reviewed: Allergy & Precautions, H&P , NPO status , Patient's Chart, lab work & pertinent test results  Airway       Dental   Pulmonary sleep apnea ,          Cardiovascular     Neuro/Psych    GI/Hepatic   Endo/Other  Morbid obesity  Renal/GU      Musculoskeletal   Abdominal   Peds  Hematology   Anesthesia Other Findings   Reproductive/Obstetrics                           Anesthesia Physical Anesthesia Plan  ASA: III  Anesthesia Plan: General   Post-op Pain Management:    Induction: Intravenous  Airway Management Planned: Mask  Additional Equipment:   Intra-op Plan:   Post-operative Plan:   Informed Consent:   Plan Discussed with:   Anesthesia Plan Comments:         Anesthesia Quick Evaluation

## 2013-07-04 ENCOUNTER — Encounter (HOSPITAL_COMMUNITY): Payer: Self-pay | Admitting: Otolaryngology

## 2013-07-04 DIAGNOSIS — R6521 Severe sepsis with septic shock: Secondary | ICD-10-CM

## 2013-07-04 DIAGNOSIS — I4891 Unspecified atrial fibrillation: Secondary | ICD-10-CM

## 2013-07-04 DIAGNOSIS — A419 Sepsis, unspecified organism: Secondary | ICD-10-CM

## 2013-07-04 LAB — CULTURE, BLOOD (ROUTINE X 2)
CULTURE: NO GROWTH
Culture: NO GROWTH

## 2013-07-04 LAB — TYPE AND SCREEN
ABO/RH(D): O POS
Antibody Screen: NEGATIVE
UNIT DIVISION: 0
Unit division: 0

## 2013-07-04 LAB — OCCULT BLOOD X 1 CARD TO LAB, STOOL: FECAL OCCULT BLD: POSITIVE — AB

## 2013-07-04 LAB — PROCALCITONIN: PROCALCITONIN: 0.6 ng/mL

## 2013-07-04 NOTE — Op Note (Unsigned)
NAME:  Lori Burnett, Lori Burnett                      ACCOUNT NO.:  MEDICAL RECORD NO.:  [id number]  LOCATION:                                 FACILITY:  PHYSICIAN:  Christopher E. Lucia Gaskins, M.D. DATE OF BIRTH:  DATE OF PROCEDURE:  07/03/2013 DATE OF DISCHARGE:                              OPERATIVE REPORT   PREOPERATIVE DIAGNOSES:  Respiratory failure with history of chronic obstructive pulmonary disease and obstructive sleep apnea.  POSTOPERATIVE DIAGNOSES:  Respiratory failure with history of chronic obstructive pulmonary disease and obstructive sleep apnea.  OPERATION PERFORMED:  Via tracheostomy with a #6 Shiley XLT CP tracheostomy.  SURGEON:  Melony Overly, M.D.  ANESTHESIA:  General endotracheal.  COMPLICATIONS:  None.  ESTIMATED BLOOD LOSS:  Minimal.  BRIEF CLINICAL NOTE:  Lori Burnett is a 78 year old female with a long history of COPD, congestive heart failure, and obstructive sleep apnea. She underwent a coronary artery bypass surgery in mid December and postoperatively has significant respiratory problems on BiPAP.  She was subsequently transferred to [facility], requiring intubation.  Respiratory felt like she would have a prolonged intubation with a very slow wean because of her significant history of COPD, obstructive sleep apnea and significant obesity.  They were unable to perform a bedside tracheostomy as she has a very short neck and obesity. I was subsequently consulted to perform a tracheostomy in the OR.  DESCRIPTION OF PROCEDURE:  After adequate endotracheal anesthesia, the patient was positioned and neck extended.  A vertical incision was made just midline just below the cricoid cartilage.  Dissection was carried out through a significant amount of subcutaneous fat.  Strap muscles were divided in midline and in the cricoid and the first 2-3 tracheal rings were identified.  At this point, the thyroid isthmus in the midline.  I performed a  horizontal tracheotomy between the first and second tracheal ring, and the endotracheal tube was removed in a #6 Shiley XLT CP trach was positioned without difficulty.  There was minimal bleeding. The trach was secured with 4-0, 2-0 silk sutures and Velcro around the neck.  She was ventilated well, and the patient was subsequently transferred back to [facility].          ______________________________ Leonides Sake. Lucia Gaskins, M.D.     CEN/MEDQ  D:  07/03/2013  T:  07/04/2013  Job:  [id number]

## 2013-07-04 NOTE — Progress Notes (Signed)
  Echocardiogram 2D Echocardiogram has been performed.  Mauricio Po 07/04/2013, 5:51 PM

## 2013-07-04 NOTE — Progress Notes (Signed)
Name: Lori Burnett MRN: [id number] DOB: 09/24/1935    ADMISSION DATE:  06/26/2013 CONSULTATION DATE: 1/9  REFERRING MD :  Lori Burnett PRIMARY SERVICE:  [facility] CHIEF COMPLAINT:  Respiratory failure   BRIEF PATIENT DESCRIPTION:  [name] [facility] post coronary artery bypass grafting on 12/22. Postop course notable for difficult wean, atrial fibrillation,UTI progressive deconditioning, and almost continuous BiPAP dependence. Transferred to [facility] on 1/9 for further supportive care.  SIGNIFICANT EVENTS / STUDIES:  CABG 12/22 1/15 trach  LINES / TUBES: oett 1/10>>>1/151/15 trach(neuman)>>  CULTURES: 1-9 uc>>ecoli>>ss roc ANTIBIOTICS: Levaquin 1/7>>>>stopped Roc 1-14>> SUBJECTIVE:  Follows commands despite sedation  VITAL SIGNS: Vital signs reviewed. Abnormal values will appear under impression plan section.     PHYSICAL EXAMINATION: General:  Morbidly obese African American female currently sedated on vent  Neuro:  Awake, oriented, profoundly weak HEENT:  Neck is large, unable to appreciate jugular venous distention, short neck. Trach #6 intact Cardiovascular:  distant Lungs:  diminished Abdomen:  Obese and without organomegaly Musculoskeletal:  Intact Skin:  intact   Recent Labs Lab 06/29/13 0505 06/30/13 0305 07/02/13 0528  NA 146 142 140  K 4.3 3.6* 3.7  CL 104 97 94*  CO2 31 32 34*  BUN 39* 41* 46*  CREATININE 1.76* 1.72* 1.50*  GLUCOSE 244* 205* 214*    Recent Labs Lab 06/30/13 0305 07/02/13 0528 07/03/13 0915  HGB 9.0* 7.6* 8.8*  HCT 26.5* 23.3* 26.6*  WBC 12.4* 11.6* 11.4*  PLT 211 222 213   Dg Chest Port 1 View  07/02/2013   CLINICAL DATA:  78 year old female intubated. Initial encounter.  EXAM: PORTABLE CHEST - 1 VIEW  COMPARISON:  DG CHEST 1V PORT dated 06/30/2013  FINDINGS: Portable AP semi upright view at I 1410 hrs. Endotracheal tube tip just below the  level the clavicles. Right chest porta cath re- identified. Stable right subclavian approach central venous catheter. Enteric tube courses to the left upper quadrant, tip not included.  Stable cardiac size and mediastinal contours. Sequelae of CABG. Mild bilateral veiling pulmonary opacity. Pulmonary vascularity appears increased. No pneumothorax. Retrocardiac opacity mildly increased.  IMPRESSION: 1.  Stable lines and tubes. 2. Mildly increased interstitial opacity with small bilateral pleural effusions, suggesting worsening interstitial edema. 3. Increased retrocardiac atelectasis or consolidation.   Electronically Signed   By: Lori Burnett M.D.   On: 07/02/2013 14:24    ASSESSMENT / PLAN: Acute on chronic respiratory failure in the setting of what appears to be multifactorial: Obesity hypoventilation syndrome, obstructive sleep apnea, profound deconditioning, and atelectasis.  Doubtful that she even has obstructive lung disease. Intubated 1/9. Continue full vent support.  Trach  1/15 Recommendation -cont  mechanical ventilation, Wean per protocol -Following tracheostomy, reinitiate weaning efforts, currently appears like she would benefit from nocturnal ventilation even after acute issues have resolved    Reported history of cardiomyopathy, recent acute MI, and status post CABG on 12/22. History of what is likely secondary pulmonary artery hypertension and cor pulmonale Pressor dependent on levophed for days> is this related to pulmonary hypertension? > No echocardiogram results available Atrial fibrillation hypertension Recommendation Wean pressors as tolerated.  Move BP cuff Check procalcitonin Check 2 d Check cortisol level Check lactic acid Consider cardiology consult> RHC  Continue supportive medical care  Hypernatremia And CRI Lab Results  Component Value Date   CREATININE 1.50* 07/02/2013   CREATININE 1.72* 06/30/2013   CREATININE 1.76* 06/29/2013    Recent  Labs Lab  06/29/13 0505 06/30/13 0305 07/02/13 0528  NA 146 142 140    Recommendation plan Supplement free water, would hold diuresis  Renal fxn improving  Anemia. hgb variable.  Plan: Per # service   [name] ACNP [name] PCCM Pager [phone] till 3 pm If no answer page [phone] 07/04/2013, 11:25 AM  Attending:  I have seen and examined the patient with nurse practitioner/resident and agree with the note above.   This lady has been on pressors for days and doesn't look particularly septic on exam.  I question whether or not this is cardiogenic shock (consider PH related).  Agree with above, echo, cardiology consult, cortisol, lactic acid  Would continue weaning protocol, oxygen requirements are minimal and she does not appear to be in resp distress  [name] PCCM Pager: [phone] Cell: [phone] If no response, call [phone]

## 2013-07-05 LAB — LACTIC ACID, PLASMA: Lactic Acid, Venous: 1.3 mmol/L (ref 0.5–2.2)

## 2013-07-05 LAB — BASIC METABOLIC PANEL
BUN: 61 mg/dL — ABNORMAL HIGH (ref 6–23)
CO2: 31 mEq/L (ref 19–32)
Calcium: 8.8 mg/dL (ref 8.4–10.5)
Chloride: 93 mEq/L — ABNORMAL LOW (ref 96–112)
Creatinine, Ser: 1.51 mg/dL — ABNORMAL HIGH (ref 0.50–1.10)
GFR calc Af Amer: 38 mL/min — ABNORMAL LOW (ref >90)
GFR calc non Af Amer: 33 mL/min — ABNORMAL LOW (ref >90)
GLUCOSE: 285 mg/dL — AB (ref 70–99)
POTASSIUM: 4.1 meq/L (ref 3.7–5.3)
Sodium: 138 mEq/L (ref 137–147)

## 2013-07-05 LAB — CBC
HCT: 19.7 % — ABNORMAL LOW (ref 36.0–46.0)
HEMOGLOBIN: 6.5 g/dL — AB (ref 12.0–15.0)
MCH: 28.9 pg (ref 26.0–34.0)
MCHC: 33 g/dL (ref 30.0–36.0)
MCV: 87.6 fL (ref 78.0–100.0)
PLATELETS: 234 10*3/uL (ref 150–400)
RBC: 2.25 MIL/uL — AB (ref 3.87–5.11)
RDW: 15.6 % — ABNORMAL HIGH (ref 11.5–15.5)
WBC: 10.5 10*3/uL (ref 4.0–10.5)

## 2013-07-05 LAB — CORTISOL
CORTISOL PLASMA: 23.2 ug/dL
Cortisol, Plasma: 19.1 ug/dL

## 2013-07-05 LAB — PREPARE RBC (CROSSMATCH)

## 2013-07-06 ENCOUNTER — Other Ambulatory Visit (HOSPITAL_COMMUNITY): Payer: Self-pay

## 2013-07-06 DIAGNOSIS — K920 Hematemesis: Secondary | ICD-10-CM

## 2013-07-06 DIAGNOSIS — D649 Anemia, unspecified: Secondary | ICD-10-CM

## 2013-07-06 DIAGNOSIS — R195 Other fecal abnormalities: Secondary | ICD-10-CM

## 2013-07-06 LAB — BASIC METABOLIC PANEL
BUN: 60 mg/dL — AB (ref 6–23)
CALCIUM: 8.7 mg/dL (ref 8.4–10.5)
CO2: 31 meq/L (ref 19–32)
Chloride: 91 mEq/L — ABNORMAL LOW (ref 96–112)
Creatinine, Ser: 1.52 mg/dL — ABNORMAL HIGH (ref 0.50–1.10)
GFR calc Af Amer: 38 mL/min — ABNORMAL LOW (ref >90)
GFR calc non Af Amer: 32 mL/min — ABNORMAL LOW (ref >90)
GLUCOSE: 351 mg/dL — AB (ref 70–99)
Potassium: 4.4 mEq/L (ref 3.7–5.3)
SODIUM: 136 meq/L — AB (ref 137–147)

## 2013-07-06 LAB — CBC
HEMATOCRIT: 24.7 % — AB (ref 36.0–46.0)
Hemoglobin: 8.2 g/dL — ABNORMAL LOW (ref 12.0–15.0)
MCH: 29.7 pg (ref 26.0–34.0)
MCHC: 33.2 g/dL (ref 30.0–36.0)
MCV: 89.5 fL (ref 78.0–100.0)
PLATELETS: 288 10*3/uL (ref 150–400)
RBC: 2.76 MIL/uL — ABNORMAL LOW (ref 3.87–5.11)
RDW: 15.4 % (ref 11.5–15.5)
WBC: 13.6 10*3/uL — AB (ref 4.0–10.5)

## 2013-07-06 LAB — HEPATIC FUNCTION PANEL
ALK PHOS: 78 U/L (ref 39–117)
ALT: 8 U/L (ref 0–35)
AST: 17 U/L (ref 0–37)
Albumin: 2.2 g/dL — ABNORMAL LOW (ref 3.5–5.2)
BILIRUBIN INDIRECT: 0.2 mg/dL — AB (ref 0.3–0.9)
BILIRUBIN TOTAL: 0.6 mg/dL (ref 0.3–1.2)
Bilirubin, Direct: 0.4 mg/dL — ABNORMAL HIGH (ref 0.0–0.3)
TOTAL PROTEIN: 5.7 g/dL — AB (ref 6.0–8.3)

## 2013-07-06 LAB — LIPASE, BLOOD: Lipase: 31 U/L (ref 11–59)

## 2013-07-06 LAB — PROTIME-INR
INR: 1.15 (ref 0.00–1.49)
Prothrombin Time: 14.5 seconds (ref 11.6–15.2)

## 2013-07-06 LAB — AMYLASE: Amylase: 49 U/L (ref 0–105)

## 2013-07-06 LAB — PROCALCITONIN: PROCALCITONIN: 0.61 ng/mL

## 2013-07-06 LAB — APTT: aPTT: 31 seconds (ref 24–37)

## 2013-07-06 NOTE — Consult Note (Addendum)
Referring Provider: [employer] Primary Care Physician:  No PCP Per Patient Primary Gastroenterologist:  None   Reason for Consultation:  Coffee ground Emesis  HPI: Lori Burnett is a 78 y.o. female  Ad[facility] to [facility] 06[facility]/13, after undergoing CABG on 06/09/13. Complicated post op course with acute on chronic respiratory failure felt multifactorial, and unable to wean. She has since had trach and remains on vent. On Plavix post CABG. Has been tolerating NG feedings until yesterday when had a couple episodes of emesis which were noted to be dark. Tube feeds palced on hold and NG hooked to suction . Has had about 700 cc coffee ground material overnight. No stools or melena today. Pt is alert but difficult to assess c/o nausea or abdominal pain. Shakes head yes to hx of ulcers. Pt does have hx of breast cancer-s/p left mastectomy, and hx of colon cancer s/p resection. She lives in [place]- no records here. Plavix stopped yesterday. Had 2 units of blood last pm for hgb 6.5-pending this am She had had HGb in the 8-9 range past couple weeks.   No past medical history on file.  Past Surgical History  Procedure Laterality Date  . Tracheostomy tube placement N/A 07/03/2013    Procedure: TRACHEOSTOMY;  Surgeon: Rozetta Nunnery, MD;  Location: [facility];  Service: ENT;  Laterality: N/A;    Prior to Admission medications   Medication Sig Start Date End Date Taking? Authorizing Provider  atenolol (TENORMIN) 100 MG tablet Take 100 mg by mouth daily.   Yes Historical Provider, MD  atorvastatin (LIPITOR) 20 MG tablet Take 20 mg by mouth daily.   Yes Historical Provider, MD  Choline Fenofibrate 135 MG capsule Take 135 mg by mouth daily.   Yes Historical Provider, MD  cloNIDine (CATAPRES) 0.1 MG tablet Take 0.1 mg by mouth.   Yes Historical Provider, MD  fluticasone (FLONASE) 50 MCG/ACT nasal spray Place into both nostrils daily.   Yes Historical Provider, MD  insulin aspart (NOVOLOG)  100 UNIT/ML injection Inject into the skin 3 (three) times daily before meals.   Yes Historical Provider, MD  insulin glargine (LANTUS) 100 UNIT/ML injection Inject into the skin at bedtime.   Yes Historical Provider, MD  montelukast (SINGULAIR) 10 MG tablet Take 10 mg by mouth at bedtime.   Yes Historical Provider, MD  nitroGLYCERIN (NITROSTAT) 0.4 MG SL tablet Place 0.4 mg under the tongue every 5 (five) minutes as needed for chest pain.   Yes Historical Provider, MD  omeprazole (PRILOSEC) 20 MG capsule Take 20 mg by mouth daily.   Yes Historical Provider, MD  PARoxetine (PAXIL) 40 MG tablet Take 40 mg by mouth every morning.   Yes Historical Provider, MD  potassium chloride SA (K-DUR,KLOR-CON) 20 MEQ tablet Take 20 mEq by mouth daily.   Yes Historical Provider, MD    No current facility-administered medications for this encounter.    Allergies as of 06/26/2013  . (Not on File)    No family history on file.  History   Social History  . Marital Status: Divorced    Spouse Name: N/A    Number of Children: N/A  . Years of Education: N/A   Occupational History  . Not on file.   Social History Main Topics  . Smoking status: Not on file  . Smokeless tobacco: Not on file  . Alcohol Use: Not on file  . Drug Use: Not on file  . Sexual Activity: Not on file   Other  Topics Concern  . Not on file   Social History Narrative  . No narrative on file    Review of Systems: .Pertinent positive and negative review of systems were noted in the above HPI section.  All other review of systems was otherwise negative.  Physical Exam: Vital signs in last 24 hours:     General:   Alert,  Well-developed, morbidly obese,AA female,alert-trached Eyes:  Sclera clear, no icterus.   Conjunctiva pink. Ears:  Normal auditory acuity. Nose:  No deformity, discharge,  or lesions.NG draining coffee ground material Mouth:  No deformity or lesions.   Neck:  trach. Lungs:  Coarse BS   Heart:  Regular  rate and rhythm; no murmurs, clicks, rubs,  or gallops.s/p left mastectomy Abdomen:large,obese,BS + midline scar, slightly distended, nontender Rectal:  Deferred - no melena per nursing, stool heme + Msk:  Symmetrical without gross deformities. . Pulses:  Normal pulses noted. Extremities:  Without clubbing or edema. Neurologic:  Alert  Skin:  Intact without significant lesions or rashes.. Psych:  Alert and cooperative. Intake/Output from previous day:   Intake/Output this shift:    Lab Results:  Recent Labs  07/05/13 0630  WBC 10.5  HGB 6.5*  HCT 19.7*  PLT 234   BMET  Recent Labs  07/05/13 0630  NA 138  K 4.1  CL 93*  CO2 31  GLUCOSE 285*  BUN 61*  CREATININE 1.51*  CALCIUM 8.8     IMPRESSION:  #1 78 yo morbidly obese female S/P CABG on 06/09/2013, and unable to wean secondary to chronic respiratory failure-multifactorial-now S/P trach and remaining on vent #2 chronic antiplatelet therpay post CABG-on plavix #3 hx breast CA #4 hx of colon CA s/p resection #5 DM #6 Acute/subacute GI bleed with coffee ground emesis x 24 hours- suspect ulcerative esophagitis vs PUD #7 Anemia -acute blood loss   PLAN:  #1 PPI BID IV #2 transfuse to keep hgb > 8 #3 stop Plavix  #4 Leave NG to suction, and hold TF #5 Will schedule for EGD tomorrow-probably in OR because of vent-with Dr. Olevia Perches.   Amy Esterwood  07/06/2013, 10:03 AM     Attending physician's note   I have taken a history, reviewed the chart and examined the patient. I agree with the Advanced Practitioner's note, impression and recommendations. Respiratory failure following CABG on 12/22 with trach on vent. Acute/subacute GI bleeding with coffee ground emesis yesterday and today has coffee ground material per NGT today and heme + stool. Suspected ulcer, gastritis or esophagitis. IV PPI BID, hold Plavix, transfusions to keep Hb > 8, monitor Hb/Hct. EGD tomorrow.  Pricilla Riffle. Fuller Plan, MD [facility]

## 2013-07-07 ENCOUNTER — Encounter: Payer: Self-pay | Admitting: *Deleted

## 2013-07-07 ENCOUNTER — Ambulatory Visit (HOSPITAL_COMMUNITY): Payer: Self-pay | Admitting: Certified Registered"

## 2013-07-07 ENCOUNTER — Encounter
Admission: AD | Disposition: A | Discharge status: Skilled Nursing Facility | Payer: Self-pay | Source: Ambulatory Visit | Attending: Internal Medicine

## 2013-07-07 ENCOUNTER — Encounter (HOSPITAL_COMMUNITY): Payer: Self-pay | Admitting: Certified Registered"

## 2013-07-07 ENCOUNTER — Other Ambulatory Visit (HOSPITAL_COMMUNITY): Payer: Self-pay

## 2013-07-07 HISTORY — PX: ESOPHAGOGASTRODUODENOSCOPY: SHX5428

## 2013-07-07 LAB — TYPE AND SCREEN
ABO/RH(D): O POS
ANTIBODY SCREEN: NEGATIVE
UNIT DIVISION: 0
Unit division: 0

## 2013-07-07 LAB — HEMOGLOBIN AND HEMATOCRIT, BLOOD
HEMATOCRIT: 23.8 % — AB (ref 36.0–46.0)
Hemoglobin: 7.9 g/dL — ABNORMAL LOW (ref 12.0–15.0)

## 2013-07-07 LAB — CBC
HEMATOCRIT: 22.6 % — AB (ref 36.0–46.0)
Hemoglobin: 7.4 g/dL — ABNORMAL LOW (ref 12.0–15.0)
MCH: 29.7 pg (ref 26.0–34.0)
MCHC: 32.7 g/dL (ref 30.0–36.0)
MCV: 90.8 fL (ref 78.0–100.0)
PLATELETS: 296 10*3/uL (ref 150–400)
RBC: 2.49 MIL/uL — ABNORMAL LOW (ref 3.87–5.11)
RDW: 16.6 % — AB (ref 11.5–15.5)
WBC: 12.5 10*3/uL — ABNORMAL HIGH (ref 4.0–10.5)

## 2013-07-07 LAB — GLUCOSE, CAPILLARY
Glucose-Capillary: 300 mg/dL — ABNORMAL HIGH (ref 70–99)
Glucose-Capillary: 261 mg/dL — ABNORMAL HIGH (ref 70–99)

## 2013-07-07 SURGERY — EGD (ESOPHAGOGASTRODUODENOSCOPY)
Anesthesia: General

## 2013-07-07 SURGERY — EGD (ESOPHAGOGASTRODUODENOSCOPY)
Anesthesia: Monitor Anesthesia Care

## 2013-07-07 MED ORDER — FENTANYL CITRATE 0.05 MG/ML IJ SOLN: 25.0000 ug | INTRAMUSCULAR | Status: DC | PRN

## 2013-07-07 MED ORDER — NOREPINEPHRINE BITARTRATE 1 MG/ML IJ SOLN
4000.0000 ug | INTRAMUSCULAR | Status: DC | PRN
  Administered 2013-07-07: 20 ug/min via INTRAVENOUS

## 2013-07-07 MED ORDER — ONDANSETRON HCL 4 MG/2ML IJ SOLN: 4.0000 mg | Freq: Once | INTRAMUSCULAR | Status: AC | PRN

## 2013-07-07 MED ORDER — PROPOFOL INFUSION 10 MG/ML OPTIME
INTRAVENOUS | Status: DC | PRN
  Administered 2013-07-07: 3 ug/kg/min via INTRAVENOUS

## 2013-07-07 MED ORDER — LACTATED RINGERS IV SOLN
INTRAVENOUS | Status: DC | PRN
  Administered 2013-07-07: 12:00:00 via INTRAVENOUS

## 2013-07-07 NOTE — Transfer of Care (Signed)
Immediate Anesthesia Transfer of Care Note  Patient: Lori Burnett  Procedure(s) Performed: Procedure(s): ESOPHAGOGASTRODUODENOSCOPY (EGD) (N/A)  Patient Location: PACU  Anesthesia Type:General  Level of Consciousness: awake and alert   Airway & Oxygen Therapy: Patient Spontanous Breathing and Patient placed on Ventilator (see vital sign flow sheet for setting)  Post-op Assessment: Report given to PACU RN and Post -op Vital signs reviewed and stable  Post vital signs: Reviewed and stable  Complications: No apparent anesthesia complications

## 2013-07-07 NOTE — Anesthesia Preprocedure Evaluation (Addendum)
Anesthesia Evaluation  Patient identified by MRN, date of birth, ID band Patient awake    Reviewed: Allergy & Precautions, H&P , NPO status , Patient's Chart, lab work & pertinent test results  History of Anesthesia Complications Negative for: history of anesthetic complications  Airway      Comment: Cuffed 7.0 cuffed shiley on vent  AC 450 x22 40% and 5 peep Dental   Pulmonary sleep apnea , COPD + rhonchi   + decreased breath sounds      Cardiovascular hypertension, Pt. on medications + CAD and +CHF Rhythm:Regular Rate:Normal     Neuro/Psych VDRF on Dip GTT negative neurological ROS     GI/Hepatic   Endo/Other  diabetesGluc 300 on arrival D 10 infusing on arrival D/C ed as per [facility]  Renal/GU      Musculoskeletal   Abdominal   Peds  Hematology  (+) anemia , Transfused last PM   Anesthesia Other Findings   Reproductive/Obstetrics                         Anesthesia Physical Anesthesia Plan  ASA: IV  Anesthesia Plan: General   Post-op Pain Management:    Induction: Inhalational  Airway Management Planned: Tracheostomy  Additional Equipment:   Intra-op Plan:   Post-operative Plan:   Informed Consent:   Dental advisory given  Plan Discussed with: CRNA, Anesthesiologist and Surgeon  Anesthesia Plan Comments:        Anesthesia Quick Evaluation

## 2013-07-07 NOTE — Preoperative (Signed)
Beta Blockers   Reason not to administer Beta Blockers:Concurrent use of intravenous inotropic medications during the perioperative

## 2013-07-07 NOTE — Op Note (Signed)
[facility] [place], [id number]   ENDOSCOPY PROCEDURE REPORT  PATIENT: Lori Burnett, Lori Burnett  MR#: [id number] BIRTHDATE: 1935/11/17 , 27  yrs. old GENDER: Female ENDOSCOPIST: Lafayette Dragon, MD REFERRED BY:  Dr Merton Border PROCEDURE DATE:  07/07/2013 PROCEDURE:  EGD w/ biopsy ASA CLASS:     Class III INDICATIONS:  Hematemesis. MEDICATIONS: MAC sedation, administered by CRNA TOPICAL ANESTHETIC: none  DESCRIPTION OF PROCEDURE: After the risks benefits and alternatives of the procedure were thoroughly explained, informed consent was obtained.  The Pentax Gastroscope [id number] endoscope was introduced through the mouth and advanced to the second portion of the duodenum. Without limitations.  The instrument was slowly withdrawn as the mucosa was fully examined.        ESOPHAGUS: The mucosa of the esophagus appeared normal., no esophagitis  STOMACH: There was mild antral gastropathy noted.  Cold forcep biopsies were taken at the antrum. NG tube induced erosions, no blood in the stomach, 150cc's of retained clear fluid aspirated  DUODENUM: A single non-bleeding round, shallow and clean-based ulcer, ranging between 3-5 mm in size, with surrounding edema was found in the duodenal bulb. Retroflexed views revealed no abnormalities.     The scope was then withdrawn from the patient and the procedure completed.  COMPLICATIONS: There were no complications. ENDOSCOPIC IMPRESSION: 1.   The mucosa of the esophagus appeared normal 2.   There was mild antral gastropathy noted [T2] 3.   Single non-bleeding ulcer, ranging between 3-5 mm in size, was found in the duodenal bulb 4. no blood in the upper GI tract, suspect  duodenal ulcer was the cause of bleeding   RECOMMENDATIONS:  We have reinsterted NG tube 19F continue PPI bid resume TF's, follow H/H REPEAT EXAM: no recall unless bleeding recurrs  eSigned:  Lafayette Dragon, MD 07/07/2013 12:30  PM   CC:  PATIENT NAME:  [name], [name] MR#: [id number]

## 2013-07-08 ENCOUNTER — Encounter (HOSPITAL_COMMUNITY): Payer: Self-pay | Admitting: Internal Medicine

## 2013-07-08 ENCOUNTER — Ambulatory Visit: Admit: 2013-07-08 | Payer: Self-pay | Admitting: Internal Medicine

## 2013-07-08 LAB — CBC WITH DIFFERENTIAL/PLATELET
BASOS PCT: 0 % (ref 0–1)
Basophils Absolute: 0 10*3/uL (ref 0.0–0.1)
EOS ABS: 0.5 10*3/uL (ref 0.0–0.7)
Eosinophils Relative: 4 % (ref 0–5)
HCT: 25.6 % — ABNORMAL LOW (ref 36.0–46.0)
Hemoglobin: 8.2 g/dL — ABNORMAL LOW (ref 12.0–15.0)
Lymphocytes Relative: 9 % — ABNORMAL LOW (ref 12–46)
Lymphs Abs: 1 10*3/uL (ref 0.7–4.0)
MCH: 29.5 pg (ref 26.0–34.0)
MCHC: 32 g/dL (ref 30.0–36.0)
MCV: 92.1 fL (ref 78.0–100.0)
Monocytes Absolute: 1.4 10*3/uL — ABNORMAL HIGH (ref 0.1–1.0)
Monocytes Relative: 13 % — ABNORMAL HIGH (ref 3–12)
Neutro Abs: 8.2 10*3/uL — ABNORMAL HIGH (ref 1.7–7.7)
Neutrophils Relative %: 74 % (ref 43–77)
PLATELETS: 305 10*3/uL (ref 150–400)
RBC: 2.78 MIL/uL — ABNORMAL LOW (ref 3.87–5.11)
RDW: 17.6 % — ABNORMAL HIGH (ref 11.5–15.5)
WBC: 11.1 10*3/uL — ABNORMAL HIGH (ref 4.0–10.5)

## 2013-07-08 LAB — PROCALCITONIN: Procalcitonin: 0.37 ng/mL

## 2013-07-08 NOTE — Anesthesia Postprocedure Evaluation (Signed)
  Anesthesia Post-op Note  Patient: Lori Burnett  Procedure(s) Performed: Procedure(s): ESOPHAGOGASTRODUODENOSCOPY (EGD) (N/A)  Patient Location: Nursing Unit  Anesthesia Type:General  Level of Consciousness: awake and alert   Airway and Oxygen Therapy: Patient remains on ventilator via trach.  Post-op Pain: none  Post-op Assessment: Post-op Vital signs reviewed  Post-op Vital Signs: Reviewed and stable  Complications: No apparent anesthesia complications

## 2013-07-08 NOTE — Progress Notes (Signed)
Daily Rounding Note  07/08/2013, 10:48 AM  LOS: 12 days   SUBJECTIVE:       No complaints.  vomited some bilious material this AM.  No nausea currently.  No abdominal pain.  OBJECTIVE:         Vital signs in last 24 hours:    Temp 98   BP 121/46  resp 22  Pulse 98  sats 99% General: obese, unhealthy, lethargic   Heart: RRR Chest: clear in front.  Trach/vent in place Abdomen: obese, huge, active BS, NT, soft.  Extremities: non-pitting edema/fat. Neuro/Psych:  Cooperative, arouses easily.   Intake/Output from previous day: 01/19 0701 - 01/20 0700 In: 100 [I.V.:100] Out: 0   Intake/Output this shift:    Lab Results:  Recent Labs  07/06/13 1227 07/07/13 0540 07/07/13 1555 07/08/13 0615  WBC 13.6*  --  12.5* 11.1*  HGB 8.2* 7.9* 7.4* 8.2*  HCT 24.7* 23.8* 22.6* 25.6*  PLT 288  --  296 305  MCV    92  BMET  Recent Labs  07/06/13 1227  NA 136*  K 4.4  CL 91*  CO2 31  GLUCOSE 351*  BUN 60*  CREATININE 1.52*  CALCIUM 8.7   LFT  Recent Labs  07/06/13 1227  PROT 5.7*  ALBUMIN 2.2*  AST 17  ALT 8  ALKPHOS 78  BILITOT 0.6  BILIDIR 0.4*  IBILI 0.2*   PT/INR  Recent Labs  07/06/13 1227  LABPROT 14.5  INR 1.15   Hepatitis Panel No results found for this basename: HEPBSAG, HCVAB, HEPAIGM, HEPBIGM,  in the last 72 hours  Studies/Results: Dg Chest Port 1 View  07/07/2013   CLINICAL DATA:  Evaluate trach  EXAM: PORTABLE CHEST - 1 VIEW  COMPARISON:  Chest radiograph 07/02/2013  FINDINGS: ET tube is present with tip projecting at the level of the clavicles. Stable positioning of 2 right-sided central venous catheters with tip projecting over the expected location the superior vena cava. ET tube courses inferior to the diaphragm, tip not included on this examination. Stable enlarged cardiac and mediastinal contours. Persistent small layering bilateral pleural effusions and underlying hazy pulmonary  opacities. Stable interstitial pulmonary opacities.  IMPRESSION: 1. Stable support apparatus 2. Small bilateral pleural effusions and underlying opacities likely active atelectasis. 3. Interstitial pulmonary edema, grossly unchanged.   Electronically Signed   By: Lovey Newcomer M.D.   On: 07/07/2013 07:50    ASSESMENT:   *  CG emesis, resolved. On Protonix BID IV. Did vomit this AM:  No CG or blood. . EGD 1/19:  Non-bleeding duodenal bulb ulcer and mild gastropathy in antrum.  ? Gastroparesis, though no retained gastric contents noted at EGD.   *  Normocytic anemia.   Hgb stable.  On po iron.    *  CABG 06/09/13 post acute MI.  Surgery in [place].  Post op resp failure requiring trach 07/03/13 (after transfer to [facility]), ongoing vent support, post op a fib. .  On Plavix post CABG Underlying obesity hypoventilation syndrome, cor pulmonale., pulmonary hypertension.   *  Hx colon cancer, s/p resection.    *  IDDM.   PLAN   *  Continue Protonix long term.  Keep head of bed elevated. If vomiting recurs, consider adding short term or prn use of low dose metoclopramide  *  GI signing off. *  For routine GI issues can return to GI MD in [place].      Judson Roch  [place]  07/08/2013, 10:48 AM  Pager: [phone] Attending MD note:   I have taken a history, examined the patient, and reviewed the chart. I agree with the Advanced Practitioner's impression and recommendations.   Melburn Popper [facility]enter[facility] Pager # [phone]

## 2013-07-09 ENCOUNTER — Other Ambulatory Visit (HOSPITAL_COMMUNITY): Payer: Self-pay

## 2013-07-09 LAB — CBC
HCT: 20.6 % — ABNORMAL LOW (ref 36.0–46.0)
Hemoglobin: 6.6 g/dL — CL (ref 12.0–15.0)
MCH: 29.5 pg (ref 26.0–34.0)
MCHC: 32 g/dL (ref 30.0–36.0)
MCV: 92 fL (ref 78.0–100.0)
PLATELETS: 253 10*3/uL (ref 150–400)
RBC: 2.24 MIL/uL — ABNORMAL LOW (ref 3.87–5.11)
RDW: 18.4 % — AB (ref 11.5–15.5)
WBC: 8.1 10*3/uL (ref 4.0–10.5)

## 2013-07-09 LAB — BASIC METABOLIC PANEL
BUN: 37 mg/dL — ABNORMAL HIGH (ref 6–23)
CALCIUM: 8.4 mg/dL (ref 8.4–10.5)
CHLORIDE: 95 meq/L — AB (ref 96–112)
CO2: 30 mEq/L (ref 19–32)
Creatinine, Ser: 1.52 mg/dL — ABNORMAL HIGH (ref 0.50–1.10)
GFR calc non Af Amer: 32 mL/min — ABNORMAL LOW (ref >90)
GFR, EST AFRICAN AMERICAN: 38 mL/min — AB (ref >90)
Glucose, Bld: 129 mg/dL — ABNORMAL HIGH (ref 70–99)
Potassium: 3.5 mEq/L — ABNORMAL LOW (ref 3.7–5.3)
Sodium: 139 mEq/L (ref 137–147)

## 2013-07-09 LAB — HEMOGLOBIN AND HEMATOCRIT, BLOOD
HCT: 23.8 % — ABNORMAL LOW (ref 36.0–46.0)
HEMOGLOBIN: 7.6 g/dL — AB (ref 12.0–15.0)

## 2013-07-09 LAB — PREPARE RBC (CROSSMATCH)

## 2013-07-09 NOTE — Progress Notes (Signed)
Name: Lori Burnett MRN: [id number] DOB: 07-09-1935    ADMISSION DATE:  06/26/2013 CONSULTATION DATE: 1/9  REFERRING MD :  Laren Everts PRIMARY SERVICE:  [facility] CHIEF COMPLAINT:  Respiratory failure   BRIEF PATIENT DESCRIPTION:  [name] artery bypass grafting on 12/22. Postop course notable for difficult wean, atrial fibrillation,UTI progressive deconditioning, and almost continuous BiPAP dependence. Transferred to [facility] on 1/9 for further supportive care.  SIGNIFICANT EVENTS / STUDIES:  CABG 12/22 1/15 trach  LINES / TUBES: oett 1/10>>>1/151/15 trach(neuman)>>  CULTURES: 1-9 uc>>ecoli>>ss roc 06/28/13 trach aspirate - rare candida    ANTIBIOTICS: Levaquin 1/7>>>>stopped Roc 1-14>>  SUBJECTIVE:  Follows commands despite sedation wit diprivan. Also on levoped. HAd EGD yesterday - healing duodenal bulb ulcer. Susptected gastroparesis, reglan recommened  VITAL SIGNS:  T 97.7 P 96 R 24 BP 104/38 on levophed and diprivan sugare 150   VENT AC, RR 22, Vt 430, Fio2 30%, peep 5 -> doing Ve 12,   PHYSICAL EXAMINATION: General:  Morbidly obese African American female currently sedated on vent . Very deconditioned Neuro:  Awake, oriented, profoundly weak HEENT:  Neck is large, unable to appreciate jugular venous distention, short neck. Trach #6 intact Cardiovascular:  distant Lungs:  diminished Abdomen:  Obese and without organomegaly Musculoskeletal:  Intact Skin:  intact  PULMONARY No results found for this basename: PHART, PCO2, PCO2ART, PO2, PO2ART, HCO3, TCO2, O2SAT,  in the last 168 hours  CBC  Recent Labs Lab 07/07/13 1555 07/08/13 0615 07/09/13 0635 07/09/13 0815  HGB 7.4* 8.2* 6.6* 7.6*  HCT 22.6* 25.6* 20.6* 23.8*  WBC 12.5* 11.1* 8.1  --   PLT 296 305 253  --     COAGULATION  Recent Labs Lab 07/02/13 1440 07/06/13 1227  INR 1.21  1.15    CARDIAC  No results found for this basename: TROPONINI,  in the last 168 hours No results found for this basename: PROBNP,  in the last 168 hours   CHEMISTRY  Recent Labs Lab 07/05/13 0630 07/06/13 1227 07/09/13 0635  NA 138 136* 139  K 4.1 4.4 3.5*  CL 93* 91* 95*  CO2 31 31 30   GLUCOSE 285* 351* 129*  BUN 61* 60* 37*  CREATININE 1.51* 1.52* 1.52*  CALCIUM 8.8 8.7 8.4   CrCl is unknown because there is no height on file for the current visit.   LIVER  Recent Labs Lab 07/02/13 1440 07/06/13 1227  AST  --  17  ALT  --  8  ALKPHOS  --  78  BILITOT  --  0.6  PROT  --  5.7*  ALBUMIN  --  2.2*  INR 1.21 1.15     INFECTIOUS  Recent Labs Lab 07/04/13 1220 07/05/13 1524 07/06/13 0759 07/08/13 0615  LATICACIDVEN  --  1.3  --   --   PROCALCITON 0.60  --  0.61 0.37     ENDOCRINE CBG (last 3)   Recent Labs  07/07/13 1123 07/07/13 1244  GLUCAP 300* 261*         IMAGING x48h  Dg Abd Portable 1v  07/09/2013   CLINICAL DATA:  Ileus.  EXAM: PORTABLE ABDOMEN - 1 VIEW  COMPARISON:  KUB 07/06/2013 and 06/27/2013.  FINDINGS: Prior CABG. Cardiomegaly. Small left pleural effusion. NG tube noted with tip in stomach. Progressive distention of small and and possibly large bowel noted consistent with progressive ileus. Bowel obstruction cannot be excluded. CT  of the abdomen should be considered for further evaluation. No free air. Degenerative changes lumbar spine.  IMPRESSION: 1. Progressive bowel distention. Although this could represent a worsening ileus, bowel obstruction cannot be excluded and CT of the abdomen and pelvis should be considered for further evaluation. NG tube noted with tip in stomach.  2. Prior CABG.  Cardiomegaly.  Small left pleural effusion.   Electronically Signed   By: Marcello Moores  Register   On: 07/09/2013 07:50      ASSESSMENT / PLAN: Acute on chronic respiratory failure in the setting of what appears to be multifactorial: Obesity  hypoventilation syndrome, obstructive sleep apnea, profound deconditioning, and atelectasis.  Doubtful that she even has obstructive lung disease. Intubated 1/9. S/p trach 1/15.    Recommendation -cont  mechanical ventilation, Wean per protocol -Following tracheostomy, reinitiate weaning efforts, currently appears like she would benefit from nocturnal ventilation even after acute issues have resolved - dc singulair - dc loratdaine    Reported history of cardiomyopathy, recent acute MI, and status post CABG on 12/22. History of what is likely secondary pulmonary artery hypertension and cor pulmonale Pressor dependent on levophed for days> is this related to pulmonary hypertension? > No echocardiogram results available Atrial fibrillation hypertension Recommendation Wean pressors as tolerated.; dc diprivan first Move BP cuff Check procalcitonin Check 2 d Check cortisol level Check lactic acid Consider cardiology consult> RHC  Continue supportive medical care  Hypernatremia And CRI  1/21/: resolved  Recommendation plan monitor  Anemia. hgb variable.  Plan: - PRBC for hgb </= 6.9gm%    - exceptions are   -  if ACS susepcted/confirmed then transfuse for hgb </= 8.0gm%,  or    - active bleeding with hemodynamic instability, then transfuse regardless of hemoglobin value   At at all times try to transfuse 1 unit prbc as possible with exception of active hemorrhage    GI A: Ileus/ s/p egd 07/07/13 P  - per primary service  The patient is critically ill with multiple organ systems failure and requires high complexity decision making for assessment and support, frequent evaluation and titration of therapies, application of advanced monitoring technologies and extensive interpretation of multiple databases.   Critical Care Time devoted to patient care services described in this note is  35  Minutes.  Dr. Brand Males, M.D., [facility].C.P Pulmonary and Critical Care  Medicine Staff Physician [place] [facility] Pager: [phone], If no answer or between  15:00h - 7:00h: call [phone]  07/09/2013 11:31 AM

## 2013-07-10 LAB — COMPREHENSIVE METABOLIC PANEL
ALT: 8 U/L (ref 0–35)
AST: 25 U/L (ref 0–37)
Albumin: 2.1 g/dL — ABNORMAL LOW (ref 3.5–5.2)
Alkaline Phosphatase: 75 U/L (ref 39–117)
BUN: 35 mg/dL — ABNORMAL HIGH (ref 6–23)
CALCIUM: 8.7 mg/dL (ref 8.4–10.5)
CO2: 32 mEq/L (ref 19–32)
CREATININE: 1.66 mg/dL — AB (ref 0.50–1.10)
Chloride: 96 mEq/L (ref 96–112)
GFR calc Af Amer: 34 mL/min — ABNORMAL LOW (ref >90)
GFR calc non Af Amer: 29 mL/min — ABNORMAL LOW (ref >90)
GLUCOSE: 106 mg/dL — AB (ref 70–99)
Potassium: 3.4 mEq/L — ABNORMAL LOW (ref 3.7–5.3)
Sodium: 141 mEq/L (ref 137–147)
Total Bilirubin: 0.5 mg/dL (ref 0.3–1.2)
Total Protein: 5.5 g/dL — ABNORMAL LOW (ref 6.0–8.3)

## 2013-07-10 LAB — CBC WITH DIFFERENTIAL/PLATELET
Basophils Absolute: 0 10*3/uL (ref 0.0–0.1)
Basophils Relative: 0 % (ref 0–1)
Eosinophils Absolute: 0.4 10*3/uL (ref 0.0–0.7)
Eosinophils Relative: 4 % (ref 0–5)
HCT: 25.6 % — ABNORMAL LOW (ref 36.0–46.0)
HEMOGLOBIN: 8.3 g/dL — AB (ref 12.0–15.0)
LYMPHS PCT: 9 % — AB (ref 12–46)
Lymphs Abs: 0.7 10*3/uL (ref 0.7–4.0)
MCH: 29.7 pg (ref 26.0–34.0)
MCHC: 32.4 g/dL (ref 30.0–36.0)
MCV: 91.8 fL (ref 78.0–100.0)
MONOS PCT: 10 % (ref 3–12)
Monocytes Absolute: 0.8 10*3/uL (ref 0.1–1.0)
NEUTROS ABS: 6.6 10*3/uL (ref 1.7–7.7)
Neutrophils Relative %: 77 % (ref 43–77)
Platelets: 290 10*3/uL (ref 150–400)
RBC: 2.79 MIL/uL — AB (ref 3.87–5.11)
RDW: 17.7 % — ABNORMAL HIGH (ref 11.5–15.5)
WBC: 8.6 10*3/uL (ref 4.0–10.5)

## 2013-07-10 LAB — MAGNESIUM: Magnesium: 2.1 mg/dL (ref 1.5–2.5)

## 2013-07-10 LAB — PHOSPHORUS: PHOSPHORUS: 3.6 mg/dL (ref 2.3–4.6)

## 2013-07-11 ENCOUNTER — Other Ambulatory Visit (HOSPITAL_COMMUNITY): Payer: Self-pay

## 2013-07-11 ENCOUNTER — Encounter: Payer: Self-pay | Admitting: Radiology

## 2013-07-11 LAB — TYPE AND SCREEN
ABO/RH(D): O POS
ANTIBODY SCREEN: NEGATIVE
UNIT DIVISION: 0
Unit division: 0

## 2013-07-11 LAB — CBC
HCT: 30.4 % — ABNORMAL LOW (ref 36.0–46.0)
HEMOGLOBIN: 9.7 g/dL — AB (ref 12.0–15.0)
MCH: 29.1 pg (ref 26.0–34.0)
MCHC: 31.9 g/dL (ref 30.0–36.0)
MCV: 91.3 fL (ref 78.0–100.0)
PLATELETS: 294 10*3/uL (ref 150–400)
RBC: 3.33 MIL/uL — ABNORMAL LOW (ref 3.87–5.11)
RDW: 17.5 % — ABNORMAL HIGH (ref 11.5–15.5)
WBC: 10.9 10*3/uL — ABNORMAL HIGH (ref 4.0–10.5)

## 2013-07-11 LAB — COMPREHENSIVE METABOLIC PANEL
ALT: 10 U/L (ref 0–35)
AST: 28 U/L (ref 0–37)
Albumin: 2.2 g/dL — ABNORMAL LOW (ref 3.5–5.2)
Alkaline Phosphatase: 81 U/L (ref 39–117)
BILIRUBIN TOTAL: 0.7 mg/dL (ref 0.3–1.2)
BUN: 32 mg/dL — ABNORMAL HIGH (ref 6–23)
CO2: 29 meq/L (ref 19–32)
Calcium: 9.2 mg/dL (ref 8.4–10.5)
Chloride: 100 mEq/L (ref 96–112)
Creatinine, Ser: 1.54 mg/dL — ABNORMAL HIGH (ref 0.50–1.10)
GFR calc Af Amer: 37 mL/min — ABNORMAL LOW (ref >90)
GFR, EST NON AFRICAN AMERICAN: 32 mL/min — AB (ref >90)
Glucose, Bld: 140 mg/dL — ABNORMAL HIGH (ref 70–99)
Potassium: 3.8 mEq/L (ref 3.7–5.3)
SODIUM: 143 meq/L (ref 137–147)
Total Protein: 5.8 g/dL — ABNORMAL LOW (ref 6.0–8.3)

## 2013-07-11 MED ORDER — IOHEXOL 300 MG/ML  SOLN
80.0000 mL | Freq: Once | INTRAMUSCULAR | Status: AC | PRN
  Administered 2013-07-11: 80 mL via INTRAVENOUS

## 2013-07-11 NOTE — H&P (Signed)
Referring Physician: [facility] HPI: Lori Burnett is an 78 y.o. female who was transferred to [facility] from [place] after CABG 12/22 and difficult to wean. The patient has PMHx significant for obesity, OSA, CHF, COPD, atrial fibrillation, HTN, DM, pulmonary HTN, and colon cancer 11 years ago s/p resection. [facility] has requested IR to place percutaneous gastric tube for malnutrition and need for long term feeding until respiratory status improves. The patient did have episode of coffee ground emesis and anemia s/p EGD and blood transfusions. H/H is currently stable, plavix has been discontinued since 1/18 and EGD revealed healing duodenal bulb ulcer. Patient is now on PPI with no new complaints of emesis or blood in stool. Concern for SBO. Patient ordered to have CT abdomen/pelvis, will await results.   Past Medical History:  Past Medical History  Diagnosis Date  . COPD (chronic obstructive pulmonary disease)   . Coronary artery disease   . Hypertension   . CHF (congestive heart failure)   . Diabetes mellitus without complication   . Sleep apnea   . Pulmonary hypertension   . PAD (peripheral artery disease)   . History of colon cancer   . HX: breast cancer     Past Surgical History:  Past Surgical History  Procedure Laterality Date  . Tracheostomy tube placement N/A 07/03/2013    Procedure: TRACHEOSTOMY;  Surgeon: Rozetta Nunnery, MD;  Location: [facility] OR;  Service: ENT;  Laterality: N/A;  . Coronary artery bypass graft    . Mastectomy    . Colon surgery    . Esophagogastroduodenoscopy N/A 07/07/2013    Procedure: ESOPHAGOGASTRODUODENOSCOPY (EGD);  Surgeon: Lafayette Dragon, MD;  Location: [facility] ENDOSCOPY;  Service: Endoscopy;  Laterality: N/A;    Family History: History reviewed. No pertinent family history.  Social History:  reports that she has never smoked. She does not have any smokeless tobacco history on file. Her alcohol and drug histories are not on file.  Allergies: Not on File  Medications:    Medication List         atenolol 100 MG tablet  Commonly known as:  TENORMIN  Take 100 mg by mouth daily.     atorvastatin 20 MG tablet  Commonly known as:  LIPITOR  Take 20 mg by mouth daily.     Choline Fenofibrate 135 MG capsule  Take 135 mg by mouth daily.     cloNIDine 0.1 MG tablet  Commonly known as:  CATAPRES  Take 0.1 mg by mouth.     fluticasone 50 MCG/ACT nasal spray  Commonly known as:  FLONASE  Place into both nostrils daily.     insulin aspart 100 UNIT/ML injection  Commonly known as:  novoLOG  Inject into the skin 3 (three) times daily before meals.     insulin glargine 100 UNIT/ML injection  Commonly known as:  LANTUS  Inject into the skin at bedtime.     montelukast 10 MG tablet  Commonly known as:  SINGULAIR  Take 10 mg by mouth at bedtime.     nitroGLYCERIN 0.4 MG SL tablet  Commonly known as:  NITROSTAT  Place 0.4 mg under the tongue every 5 (five) minutes as needed for chest pain.     omeprazole 20 MG capsule  Commonly known as:  PRILOSEC  Take 20 mg by mouth daily.     PARoxetine 40 MG tablet  Commonly known as:  PAXIL  Take 40 mg by mouth every morning.     potassium chloride SA 20 MEQ  tablet  Commonly known as:  K-DUR,KLOR-CON  Take 20 mEq by mouth daily.       Please HPI for pertinent positives, otherwise complete 10 system ROS negative.  Physical Exam: BP 109/52  Pulse 98  Temp(Src) 98 F (36.7 C) (Oral)  Resp 22  SpO2 99% There is no height or weight on file to calculate BMI.  General Appearance:  Alert, cooperative, no distress, tracheostomy intact  Head:  Normocephalic, without obvious abnormality, atraumatic  Neck: Supple, symmetrical, trachea midline  Lungs:   Diminished with rales bilaterally, no w/r/r, respirations unlabored without use of accessory muscles.  Chest Wall:  No tenderness or deformity, CABG sternal wounds intact.  Heart:  Irregularly irregular, S1, S2 normal, no murmur, rub or gallop.  Abdomen:    Soft, non-tender, non distended, (+) hypo BS  Extremities: Extremities normal, atraumatic, no cyanosis or edema  Neurologic: Normal affect, no gross deficits.   Results for orders placed during the hospital encounter of 06/26/13 (from the past 48 hour(s))  TYPE AND SCREEN     Status: None   Collection Time    07/09/13  5:35 PM      Result Value Range   ABO/RH(D) O POS     Antibody Screen NEG     Sample Expiration 07/12/2013     Unit Number [id number]     Blood Component Type RED CELLS,LR     Unit division 00     Status of Unit ISSUED,FINAL     Transfusion Status OK TO TRANSFUSE     Crossmatch Result Compatible     Unit Number [id number]     Blood Component Type RED CELLS,LR     Unit division 00     Status of Unit ISSUED,FINAL     Transfusion Status OK TO TRANSFUSE     Crossmatch Result Compatible    PREPARE RBC (CROSSMATCH)     Status: None   Collection Time    07/09/13  5:35 PM      Result Value Range   Order Confirmation ORDER PROCESSED BY BLOOD BANK    COMPREHENSIVE METABOLIC PANEL     Status: Abnormal   Collection Time    07/10/13  9:00 AM      Result Value Range   Sodium 141  137 - 147 mEq/L   Potassium 3.4 (*) 3.7 - 5.3 mEq/L   Chloride 96  96 - 112 mEq/L   CO2 32  19 - 32 mEq/L   Glucose, Bld 106 (*) 70 - 99 mg/dL   BUN 35 (*) 6 - 23 mg/dL   Creatinine, Ser 1.66 (*) 0.50 - 1.10 mg/dL   Calcium 8.7  8.4 - 10.5 mg/dL   Total Protein 5.5 (*) 6.0 - 8.3 g/dL   Albumin 2.1 (*) 3.5 - 5.2 g/dL   AST 25  0 - 37 U/L   ALT 8  0 - 35 U/L   Alkaline Phosphatase 75  39 - 117 U/L   Total Bilirubin 0.5  0.3 - 1.2 mg/dL   GFR calc non Af Amer 29 (*) >90 mL/min   GFR calc Af Amer 34 (*) >90 mL/min   Comment: (NOTE)     The eGFR has been calculated using the CKD EPI equation.     This calculation has not been validated in all clinical situations.     eGFR's persistently <90 mL/min signify possible Chronic Kidney     Disease.  CBC WITH DIFFERENTIAL     Status: Abnormal  Collection Time    07/10/13  9:00 AM      Result Value Range   WBC 8.6  4.0 - 10.5 K/uL   RBC 2.79 (*) 3.87 - 5.11 MIL/uL   Hemoglobin 8.3 (*) 12.0 - 15.0 g/dL   HCT 25.6 (*) 36.0 - 46.0 %   MCV 91.8  78.0 - 100.0 fL   MCH 29.7  26.0 - 34.0 pg   MCHC 32.4  30.0 - 36.0 g/dL   RDW 17.7 (*) 11.5 - 15.5 %   Platelets 290  150 - 400 K/uL   Neutrophils Relative % 77  43 - 77 %   Neutro Abs 6.6  1.7 - 7.7 K/uL   Lymphocytes Relative 9 (*) 12 - 46 %   Lymphs Abs 0.7  0.7 - 4.0 K/uL   Monocytes Relative 10  3 - 12 %   Monocytes Absolute 0.8  0.1 - 1.0 K/uL   Eosinophils Relative 4  0 - 5 %   Eosinophils Absolute 0.4  0.0 - 0.7 K/uL   Basophils Relative 0  0 - 1 %   Basophils Absolute 0.0  0.0 - 0.1 K/uL  MAGNESIUM     Status: None   Collection Time    07/10/13  9:00 AM      Result Value Range   Magnesium 2.1  1.5 - 2.5 mg/dL  PHOSPHORUS     Status: None   Collection Time    07/10/13  9:00 AM      Result Value Range   Phosphorus 3.6  2.3 - 4.6 mg/dL  COMPREHENSIVE METABOLIC PANEL     Status: Abnormal   Collection Time    07/11/13  8:15 AM      Result Value Range   Sodium 143  137 - 147 mEq/L   Potassium 3.8  3.7 - 5.3 mEq/L   Chloride 100  96 - 112 mEq/L   CO2 29  19 - 32 mEq/L   Glucose, Bld 140 (*) 70 - 99 mg/dL   BUN 32 (*) 6 - 23 mg/dL   Creatinine, Ser 1.54 (*) 0.50 - 1.10 mg/dL   Calcium 9.2  8.4 - 10.5 mg/dL   Total Protein 5.8 (*) 6.0 - 8.3 g/dL   Albumin 2.2 (*) 3.5 - 5.2 g/dL   AST 28  0 - 37 U/L   ALT 10  0 - 35 U/L   Alkaline Phosphatase 81  39 - 117 U/L   Total Bilirubin 0.7  0.3 - 1.2 mg/dL   GFR calc non Af Amer 32 (*) >90 mL/min   GFR calc Af Amer 37 (*) >90 mL/min   Comment: (NOTE)     The eGFR has been calculated using the CKD EPI equation.     This calculation has not been validated in all clinical situations.     eGFR's persistently <90 mL/min signify possible Chronic Kidney     Disease.  CBC     Status: Abnormal   Collection Time    07/11/13   8:15 AM      Result Value Range   WBC 10.9 (*) 4.0 - 10.5 K/uL   RBC 3.33 (*) 3.87 - 5.11 MIL/uL   Hemoglobin 9.7 (*) 12.0 - 15.0 g/dL   HCT 30.4 (*) 36.0 - 46.0 %   MCV 91.3  78.0 - 100.0 fL   MCH 29.1  26.0 - 34.0 pg   MCHC 31.9  30.0 - 36.0 g/dL   RDW 17.5 (*) 11.5 - 15.5 %  Platelets 294  150 - 400 K/uL   Dg Chest Port 1 View  07/11/2013   CLINICAL DATA:  Respiratory failure.  EXAM: PORTABLE CHEST - 1 VIEW  COMPARISON:  07/07/2013  FINDINGS: Endotracheal tube remains in place with tip of the level of the clavicular heads. Previous enteric tube passed has been removed, and a larger caliber enteric tube has been placed which courses into the left upper abdomen with tip not imaged. Right jugular and right subclavian central venous catheters do not appear significantly changed with tips overlying the mid to upper SVC and the lower SVC, respectively. Sequelae of prior CABG are again identified with stable cardiac enlargement. Small bilateral pleural effusions and mild pulmonary edema do not appear significantly changed. No pneumothorax is identified.  IMPRESSION: 1. Unchanged appearance of endotracheal tube and central venous catheters. 2. Unchanged appearance of mild pulmonary edema and small effusions.   Electronically Signed   By: Logan Bores   On: 07/11/2013 07:40   Dg Abd Portable 1v  07/09/2013   CLINICAL DATA:  Feeding tube placement  EXAM: PORTABLE ABDOMEN - 1 VIEW  COMPARISON:  None.  FINDINGS: Weighted feeding tube is identified with tip in the distal stomach.  IMPRESSION: Feeding tube tip is in the distal stomach.   Electronically Signed   By: Kerby Moors M.D.   On: 07/09/2013 19:13    Assessment/Plan CAD s/p CABG 06/09/13 Post-op respiratory failure unable to wean s/p tracheostomy placed 07/03/13 Malnutrition, request for image guided percutaneous gastric tube placement.  KUB reviewed with Dr. Pascal Lux from 07/09/13 revealed concern for ileus, [facility] has since ordered CT abdomen/pelvis  for concern of SBO, will await these results, if no SBO then give barium via NGT on Sunday 1/25 for possible gastric tube placement for 07/14/13.   History of colon cancer s/p resection 11 years ago. Coffee ground emesis s/p EGD 07/08/13 with healing duodenal bulb ulcer on PPI now, H/H improving, plavix stopped 07/06/13. Labs reviewed, ancef to be ordered if ileus improves, patient is afebrile and not on any blood thinners.  Risks and Benefits discussed with the patient and her daughter over the phone. All questions were answered, patient and daughter agreeable to proceed. Consent signed and in IR. Atrial fibrillation, rates controlled. OSA. HTN. CHF. COPD. DM.  Tsosie Billing D PA-C 07/11/2013, 4:22 PM

## 2013-07-11 NOTE — Progress Notes (Signed)
Name: Lori Burnett MRN: [id number] DOB: 1935/09/02    ADMISSION DATE:  06/26/2013 CONSULTATION DATE: 1/9  REFERRING MD :  Laren Everts PRIMARY SERVICE:  [facility] CHIEF COMPLAINT:  Respiratory failure   BRIEF PATIENT DESCRIPTION:  [name] artery bypass grafting on 12/22. Postop course notable for difficult wean, atrial fibrillation,UTI progressive deconditioning, and almost continuous BiPAP dependence. Transferred to [facility] on 1/9 for further supportive care.  SIGNIFICANT EVENTS / STUDIES:  CABG 12/22 1/15 trach 1/21Follows commands despite sedation wit diprivan. Also on levoped. HAd EGD yesterday - healing duodenal bulb ulcer. Susptected gastroparesis, reglan recommened  LINES / TUBES: oett 1/10>>>1/151/15 trach(neuman)>>  CULTURES: 1-9 uc>>ecoli>>ss roc 06/28/13 trach aspirate - rare candida     ANTIBIOTICS: Levaquin 1/7>>>>stopped Roc 1-14>>  SUBJECTIVE:  07/11/13: Off pressors and following commands. Off sedation gtt but stil deconditioned and full vent dependent  VITAL SIGNS:  T 97.7 P 96 R 24 BP 104/38 on levophed and diprivan sugare 150   VENT AC, RR 22, Vt 430, Fio2 30%, peep 5 -> doing Ve 12,   PHYSICAL EXAMINATION: General:  Morbidly obese African American female currently sedated on vent . Very deconditioned Neuro:  Awake, oriented, profoundly weak HEENT:  Neck is large, unable to appreciate jugular venous distention, short neck. Trach #6 intact Cardiovascular:  distant Lungs:  diminished Abdomen:  Obese and without organomegaly Musculoskeletal:  Intact Skin:  intact  PULMONARY No results found for this basename: PHART, PCO2, PCO2ART, PO2, PO2ART, HCO3, TCO2, O2SAT,  in the last 168 hours  CBC  Recent Labs Lab 07/09/13 0635 07/09/13 0815 07/10/13 0900 07/11/13 0815  HGB 6.6* 7.6* 8.3* 9.7*  HCT 20.6* 23.8* 25.6* 30.4*  WBC 8.1   --  8.6 10.9*  PLT 253  --  290 294    COAGULATION  Recent Labs Lab 07/06/13 1227  INR 1.15    CARDIAC  No results found for this basename: TROPONINI,  in the last 168 hours No results found for this basename: PROBNP,  in the last 168 hours   CHEMISTRY  Recent Labs Lab 07/05/13 0630 07/06/13 1227 07/09/13 0635 07/10/13 0900 07/11/13 0815  NA 138 136* 139 141 143  K 4.1 4.4 3.5* 3.4* 3.8  CL 93* 91* 95* 96 100  CO2 31 31 30  32 29  GLUCOSE 285* 351* 129* 106* 140*  BUN 61* 60* 37* 35* 32*  CREATININE 1.51* 1.52* 1.52* 1.66* 1.54*  CALCIUM 8.8 8.7 8.4 8.7 9.2  MG  --   --   --  2.1  --   PHOS  --   --   --  3.6  --    CrCl is unknown because there is no height on file for the current visit.   LIVER  Recent Labs Lab 07/06/13 1227 07/10/13 0900 07/11/13 0815  AST 17 25 28   ALT 8 8 10   ALKPHOS 78 75 81  BILITOT 0.6 0.5 0.7  PROT 5.7* 5.5* 5.8*  ALBUMIN 2.2* 2.1* 2.2*  INR 1.15  --   --      INFECTIOUS  Recent Labs Lab 07/04/13 1220 07/05/13 1524 07/06/13 0759 07/08/13 0615  LATICACIDVEN  --  1.3  --   --   PROCALCITON 0.60  --  0.61 0.37     ENDOCRINE CBG (last 3)  No results found for this basename: GLUCAP,  in the last 72 hours       IMAGING  x48h  Dg Chest Port 1 View  07/11/2013   CLINICAL DATA:  Respiratory failure.  EXAM: PORTABLE CHEST - 1 VIEW  COMPARISON:  07/07/2013  FINDINGS: Endotracheal tube remains in place with tip of the level of the clavicular heads. Previous enteric tube passed has been removed, and a larger caliber enteric tube has been placed which courses into the left upper abdomen with tip not imaged. Right jugular and right subclavian central venous catheters do not appear significantly changed with tips overlying the mid to upper SVC and the lower SVC, respectively. Sequelae of prior CABG are again identified with stable cardiac enlargement. Small bilateral pleural effusions and mild pulmonary edema do not appear  significantly changed. No pneumothorax is identified.  IMPRESSION: 1. Unchanged appearance of endotracheal tube and central venous catheters. 2. Unchanged appearance of mild pulmonary edema and small effusions.   Electronically Signed   By: Logan Bores   On: 07/11/2013 07:40   Dg Abd Portable 1v  07/09/2013   CLINICAL DATA:  Feeding tube placement  EXAM: PORTABLE ABDOMEN - 1 VIEW  COMPARISON:  None.  FINDINGS: Weighted feeding tube is identified with tip in the distal stomach.  IMPRESSION: Feeding tube tip is in the distal stomach.   Electronically Signed   By: Kerby Moors M.D.   On: 07/09/2013 19:13      ASSESSMENT / PLAN: Acute on chronic respiratory failure in the setting of what appears to be multifactorial: Obesity hypoventilation syndrome, obstructive sleep apnea, profound deconditioning, and atelectasis.  Doubtful that she even has obstructive lung disease. Intubated 1/9. S/p trach 1/15.  Singulair dc'ed 07/09/13. Off pressors and continus sedatin gtt 07/09/13   Recommendation -cont  mechanical ventilation, Wean per protocol -Following tracheostomy, reinitiate weaning efforts, currently appears like she would benefit from nocturnal ventilation even after acute issues have resolved   Reported history of cardiomyopathy, recent acute MI, and status post CABG on 12/22. History of what is likely secondary pulmonary artery hypertension and cor pulmonale Pressor dependent on levophed for days> is this related to pulmonary hypertension? > No echocardiogram results available Atrial fibrillation hypertension  Off pressors and diprivan since 07/09/13  Recommendation Consider cardiology consult> RHC depending on course Continue supportive medical care  Hypernatremia And CRI  1/21/: resolved  Recommendation plan monitor  Anemia. hgb variable.  Plan: - PRBC for hgb </= 6.9gm%    - exceptions are   -  if ACS susepcted/confirmed then transfuse for hgb </= 8.0gm%,  or    - active  bleeding with hemodynamic instability, then transfuse regardless of hemoglobin value   At at all times try to transfuse 1 unit prbc as possible with exception of active hemorrhage    GI A: Ileus/ s/p egd 07/07/13 P  - per primary service  The patient is critically ill with multiple organ systems failure and requires high complexity decision making for assessment and support, frequent evaluation and titration of therapies, application of advanced monitoring technologies and extensive interpretation of multiple databases.   Critical Care Time devoted to patient care services described in this note is  35  Minutes.  Dr. Brand Males, M.D., [facility].C.P Pulmonary and Critical Care Medicine Staff Physician [place] [facility] Pager: [phone], If no answer or between  15:00h - 7:00h: call [phone]  07/11/2013 11:21 AM

## 2013-07-12 LAB — DIFFERENTIAL
BASOS ABS: 0 10*3/uL (ref 0.0–0.1)
Basophils Relative: 0 % (ref 0–1)
Eosinophils Absolute: 0.3 10*3/uL (ref 0.0–0.7)
Eosinophils Relative: 3 % (ref 0–5)
LYMPHS PCT: 10 % — AB (ref 12–46)
Lymphs Abs: 1 10*3/uL (ref 0.7–4.0)
MONOS PCT: 8 % (ref 3–12)
Monocytes Absolute: 0.8 10*3/uL (ref 0.1–1.0)
Neutro Abs: 7.9 10*3/uL — ABNORMAL HIGH (ref 1.7–7.7)
Neutrophils Relative %: 79 % — ABNORMAL HIGH (ref 43–77)

## 2013-07-13 LAB — CBC
HCT: 31.8 % — ABNORMAL LOW (ref 36.0–46.0)
Hemoglobin: 10.2 g/dL — ABNORMAL LOW (ref 12.0–15.0)
MCH: 29.7 pg (ref 26.0–34.0)
MCHC: 32.1 g/dL (ref 30.0–36.0)
MCV: 92.4 fL (ref 78.0–100.0)
PLATELETS: 280 10*3/uL (ref 150–400)
RBC: 3.44 MIL/uL — ABNORMAL LOW (ref 3.87–5.11)
RDW: 16.9 % — AB (ref 11.5–15.5)
WBC: 9.4 10*3/uL (ref 4.0–10.5)

## 2013-07-13 LAB — BLOOD GAS, ARTERIAL
Acid-Base Excess: 7.1 mmol/L — ABNORMAL HIGH (ref 0.0–2.0)
BICARBONATE: 31 meq/L — AB (ref 20.0–24.0)
FIO2: 0.28 %
MECHVT: 430 mL
O2 Saturation: 99.1 %
PCO2 ART: 43.3 mmHg (ref 35.0–45.0)
PEEP/CPAP: 5 cmH2O
Patient temperature: 98.6
RATE: 22 resp/min
TCO2: 32.3 mmol/L (ref 0–100)
pH, Arterial: 7.468 — ABNORMAL HIGH (ref 7.350–7.450)
pO2, Arterial: 118 mmHg — ABNORMAL HIGH (ref 80.0–100.0)

## 2013-07-13 LAB — OCCULT BLOOD X 1 CARD TO LAB, STOOL: Fecal Occult Bld: POSITIVE — AB

## 2013-07-13 MED ORDER — CEFAZOLIN SODIUM-DEXTROSE 2-3 GM-% IV SOLR
2.0000 g | Freq: Once | INTRAVENOUS | Status: AC
  Administered 2013-07-14: 2 g via INTRAVENOUS
  Filled 2013-07-13: qty 50

## 2013-07-14 ENCOUNTER — Other Ambulatory Visit (HOSPITAL_COMMUNITY): Payer: Self-pay

## 2013-07-14 LAB — BLOOD GAS, ARTERIAL
ACID-BASE EXCESS: 6.8 mmol/L — AB (ref 0.0–2.0)
Bicarbonate: 30.9 mEq/L — ABNORMAL HIGH (ref 20.0–24.0)
FIO2: 0.28 %
LHR: 22 {breaths}/min
MECHVT: 430 mL
O2 Saturation: 99.1 %
PCO2 ART: 44.3 mmHg (ref 35.0–45.0)
PEEP: 5 cmH2O
PO2 ART: 122 mmHg — AB (ref 80.0–100.0)
Patient temperature: 98.6
TCO2: 32.2 mmol/L (ref 0–100)
pH, Arterial: 7.457 — ABNORMAL HIGH (ref 7.350–7.450)

## 2013-07-14 LAB — CBC
HCT: 31 % — ABNORMAL LOW (ref 36.0–46.0)
HEMOGLOBIN: 9.8 g/dL — AB (ref 12.0–15.0)
MCH: 29.6 pg (ref 26.0–34.0)
MCHC: 31.6 g/dL (ref 30.0–36.0)
MCV: 93.7 fL (ref 78.0–100.0)
PLATELETS: 278 10*3/uL (ref 150–400)
RBC: 3.31 MIL/uL — ABNORMAL LOW (ref 3.87–5.11)
RDW: 16.6 % — ABNORMAL HIGH (ref 11.5–15.5)
WBC: 8 10*3/uL (ref 4.0–10.5)

## 2013-07-14 LAB — TRIGLYCERIDES: TRIGLYCERIDES: 89 mg/dL (ref <150)

## 2013-07-14 LAB — DIFFERENTIAL
BASOS ABS: 0 10*3/uL (ref 0.0–0.1)
Basophils Relative: 0 % (ref 0–1)
EOS ABS: 0.4 10*3/uL (ref 0.0–0.7)
Eosinophils Relative: 6 % — ABNORMAL HIGH (ref 0–5)
Lymphocytes Relative: 12 % (ref 12–46)
Lymphs Abs: 0.9 10*3/uL (ref 0.7–4.0)
Monocytes Absolute: 0.9 10*3/uL (ref 0.1–1.0)
Monocytes Relative: 12 % (ref 3–12)
Neutro Abs: 5.7 10*3/uL (ref 1.7–7.7)
Neutrophils Relative %: 71 % (ref 43–77)

## 2013-07-14 LAB — COMPREHENSIVE METABOLIC PANEL
ALK PHOS: 77 U/L (ref 39–117)
ALT: 8 U/L (ref 0–35)
AST: 18 U/L (ref 0–37)
Albumin: 2.1 g/dL — ABNORMAL LOW (ref 3.5–5.2)
BUN: 44 mg/dL — ABNORMAL HIGH (ref 6–23)
CALCIUM: 9.1 mg/dL (ref 8.4–10.5)
CO2: 29 mEq/L (ref 19–32)
Chloride: 102 mEq/L (ref 96–112)
Creatinine, Ser: 1.36 mg/dL — ABNORMAL HIGH (ref 0.50–1.10)
GFR calc Af Amer: 43 mL/min — ABNORMAL LOW (ref >90)
GFR, EST NON AFRICAN AMERICAN: 37 mL/min — AB (ref >90)
Glucose, Bld: 168 mg/dL — ABNORMAL HIGH (ref 70–99)
Potassium: 4.4 mEq/L (ref 3.7–5.3)
SODIUM: 144 meq/L (ref 137–147)
Total Bilirubin: 0.5 mg/dL (ref 0.3–1.2)
Total Protein: 5.6 g/dL — ABNORMAL LOW (ref 6.0–8.3)

## 2013-07-14 LAB — PHOSPHORUS: PHOSPHORUS: 3.6 mg/dL (ref 2.3–4.6)

## 2013-07-14 LAB — MAGNESIUM: MAGNESIUM: 2.4 mg/dL (ref 1.5–2.5)

## 2013-07-14 LAB — PREALBUMIN: Prealbumin: 9.8 mg/dL — ABNORMAL LOW (ref 17.0–34.0)

## 2013-07-14 MED ORDER — IOHEXOL 300 MG/ML  SOLN
50.0000 mL | Freq: Once | INTRAMUSCULAR | Status: AC | PRN
  Administered 2013-07-14: 20 mL via ORAL

## 2013-07-14 MED ORDER — FENTANYL CITRATE 0.05 MG/ML IJ SOLN
INTRAMUSCULAR | Status: AC
  Filled 2013-07-14: qty 4

## 2013-07-14 MED ORDER — FENTANYL CITRATE 0.05 MG/ML IJ SOLN
INTRAMUSCULAR | Status: AC | PRN
  Administered 2013-07-14: 50 ug via INTRAVENOUS

## 2013-07-14 MED ORDER — SODIUM CHLORIDE 0.9 % IV SOLN
INTRAVENOUS | Status: AC | PRN
  Administered 2013-07-14: 50 mL/h via INTRAVENOUS

## 2013-07-14 MED ORDER — MIDAZOLAM HCL 2 MG/2ML IJ SOLN
INTRAMUSCULAR | Status: AC | PRN
  Administered 2013-07-14 (×2): 1 mg via INTRAVENOUS

## 2013-07-14 MED ORDER — MIDAZOLAM HCL 2 MG/2ML IJ SOLN
INTRAMUSCULAR | Status: AC
  Filled 2013-07-14: qty 4

## 2013-07-14 NOTE — Progress Notes (Deleted)
Name: Lori Burnett MRN: [id number] DOB: 1936-01-12    ADMISSION DATE:  06/26/2013 CONSULTATION DATE: 1/9  REFERRING MD :  Laren Everts PRIMARY SERVICE:  [facility] CHIEF COMPLAINT:  Respiratory failure   BRIEF PATIENT DESCRIPTION:  [name] artery bypass grafting on 12/22. Postop course notable for difficult wean, atrial fibrillation,UTI progressive deconditioning, and almost continuous BiPAP dependence. Transferred to [facility] on 1/9 for further supportive care.  SIGNIFICANT EVENTS / STUDIES:  CABG 12/22 1/15 trach 1/21Follows commands despite sedation wit diprivan. Also on levoped. HAd EGD yesterday - healing duodenal bulb ulcer. Susptected gastroparesis, reglan recommened  LINES / TUBES: oett 1/10>>>07/03/13 trach(neuman)>>  CULTURES: 1-9 uc>>ecoli>>ss roc 06/28/13 trach aspirate - rare candida  ANTIBIOTICS: Levaquin 1/7>>>>stopped Roc 1-14>>  SUBJECTIVE:  No distress   VITAL SIGNS:  40% on PSV 95%   PHYSICAL EXAMINATION: General:  Morbidly obese African American female currently sedated on vent . Very deconditioned Neuro:  Awake, oriented, profoundly weak HEENT:  Neck is large, unable to appreciate jugular venous distention, short neck. Trach #6 intact Cardiovascular:  distant Lungs:  diminished Abdomen:  Obese and without organomegaly Musculoskeletal:  Intact Skin:  intact  PULMONARY  Recent Labs Lab 07/13/13 0443 07/14/13 0351  PHART 7.468* 7.457*  PCO2ART 43.3 44.3  PO2ART 118.0* 122.0*  HCO3 31.0* 30.9*  TCO2 32.3 32.2  O2SAT 99.1 99.1    CBC  Recent Labs Lab 07/11/13 0815 07/13/13 0837 07/14/13 0628  HGB 9.7* 10.2* 9.8*  HCT 30.4* 31.8* 31.0*  WBC 10.9* 9.4 8.0  PLT 294 280 278    COAGULATION No results found for this basename: INR,  in the last 168 hours  CARDIAC  No results found for this basename: TROPONINI,  in the last 168  hours No results found for this basename: PROBNP,  in the last 168 hours   CHEMISTRY  Recent Labs Lab 07/09/13 0635 07/10/13 0900 07/11/13 0815 07/14/13 0628  NA 139 141 143 144  K 3.5* 3.4* 3.8 4.4  CL 95* 96 100 102  CO2 30 32 29 29  GLUCOSE 129* 106* 140* 168*  BUN 37* 35* 32* 44*  CREATININE 1.52* 1.66* 1.54* 1.36*  CALCIUM 8.4 8.7 9.2 9.1  MG  --  2.1  --  2.4  PHOS  --  3.6  --  3.6   CrCl is unknown because there is no height on file for the current visit.   LIVER  Recent Labs Lab 07/10/13 0900 07/11/13 0815 07/14/13 0628  AST 25 28 18   ALT 8 10 8   ALKPHOS 75 81 77  BILITOT 0.5 0.7 0.5  PROT 5.5* 5.8* 5.6*  ALBUMIN 2.1* 2.2* 2.1*     INFECTIOUS  Recent Labs Lab 07/08/13 0615  PROCALCITON 0.37     ENDOCRINE CBG (last 3)  No results found for this basename: GLUCAP,  in the last 72 hours  IMAGING x48h  Dg Chest Port 1 View  07/14/2013   CLINICAL DATA:  Respiratory failure.  EXAM: PORTABLE CHEST - 1 VIEW  COMPARISON:  07/11/2013.  FINDINGS: Cardiac silhouette is enlarged. An enteric feeding tube is once again appreciated tip down view of study. An endotracheal tube is appreciated to the level clavicles. Right-sided central venous catheter is appreciated via a right internal jugular and right subclavian approach with tips projected regions superior vena cava and superior vena caval right atrial junction. Bilateral pulmonary opacities are appreciated with slight increased conspicuity  when compared to previous study project within the lung bases. Patient is status post median sternotomy coronary artery bypass grafting. Atherosclerotic calcifications identified within the aorta. The osseous structures unremarkable.  IMPRESSION: Stable support lines and tubes. Bilateral pulmonary opacities likely represent underlying component of pulmonary edema, slightly increased. Otherwise stable chest radiograph.   Electronically Signed   By: Margaree Mackintosh M.D.   On:  07/14/2013 08:06  bilateral airspace disease c/w edema     ASSESSMENT / PLAN: Acute on chronic respiratory failure in the setting of what appears to be multifactorial: Obesity hypoventilation syndrome, obstructive sleep apnea, profound deconditioning, and atelectasis.  Suprisingly her ECHO does not suggest much in the way of RV dysfxn or elevated PAPs  Doubtful she actually has obstructive lung disease. Intubated 1/9. S/p trach 1/15.   Recommendation -cont  mechanical ventilation, Wean per protocol -wean as tolerated  -will need to decide on nocturnal vent requirements  -diuresis as BP/BUN and cr allow   Reported history of cardiomyopathy, recent acute MI, and status post CABG on 12/22. Atrial fibrillation Hypertension ECHO 1/16: EF 65-70%, RA nml.  Recommendation Continue supportive medical care  CRI Recommendation plan monitor  Anemia. hgb variable.  Plan: - PRBC for hgb </= 6.9gm%     Ileus/ s/p egd 07/07/13 P  - per primary service   07/14/2013 11:09 AM

## 2013-07-14 NOTE — Procedures (Signed)
20F gastrostomy tube placement under fluoro No complication No blood loss. See complete dictation in [facility] PACS.  

## 2013-07-14 NOTE — ED Notes (Signed)
Report given to [place] on 5700.

## 2013-07-14 NOTE — ED Notes (Signed)
Awaiting transport.

## 2013-07-14 NOTE — ED Notes (Signed)
Awaiting antibiotic from pharmacy.

## 2013-07-14 NOTE — Progress Notes (Signed)
PULMONARY  / CRITICAL CARE MEDICINE  Name: Lori Burnett MRN: [id number] DOB: 06-12-1936    SUBJECTIVE: Off vasopressors.  PHYSICAL EXAMINATION:  T 98.4 P 86 R 22 BP 98/56 SpO2 100  General:  Morbidly obese, in no distress Neuro:  Awake, non focal HEENT:  Unable to evaluate JVD, tracheostomy site intact Cardiovascular:  Distant heart sounds Lungs:  Diminished breath sounds Abdomen:  Obese, bowel sounds present Musculoskeletal:  Moves all extremities Skin:  Intact  LABS:  Recent Labs Lab 07/08/13 0615  07/10/13 0900 07/11/13 0815 07/13/13 0443 07/13/13 0837 07/14/13 0351 07/14/13 0628  HGB 8.2*  < > 8.3* 9.7*  --  10.2*  --  9.8*  WBC 11.1*  < > 8.6 10.9*  --  9.4  --  8.0  PLT 305  < > 290 294  --  280  --  278  NA  --   < > 141 143  --   --   --  144  K  --   < > 3.4* 3.8  --   --   --  4.4  CL  --   < > 96 100  --   --   --  102  CO2  --   < > 32 29  --   --   --  29  GLUCOSE  --   < > 106* 140*  --   --   --  168*  BUN  --   < > 35* 32*  --   --   --  44*  CREATININE  --   < > 1.66* 1.54*  --   --   --  1.36*  CALCIUM  --   < > 8.7 9.2  --   --   --  9.1  MG  --   --  2.1  --   --   --   --  2.4  PHOS  --   --  3.6  --   --   --   --  3.6  AST  --   --  25 28  --   --   --  18  ALT  --   --  8 10  --   --   --  8  ALKPHOS  --   --  75 81  --   --   --  77  BILITOT  --   --  0.5 0.7  --   --   --  0.5  PROT  --   --  5.5* 5.8*  --   --   --  5.6*  ALBUMIN  --   --  2.1* 2.2*  --   --   --  2.1*  PROCALCITON 0.37  --   --   --   --   --   --   --   PHART  --   --   --   --  7.468*  --  7.457*  --   PCO2ART  --   --   --   --  43.3  --  44.3  --   PO2ART  --   --   --   --  118.0*  --  122.0*  --   < > = values in this interval not displayed. No results found for this basename: GLUCAP,  in the last 168 hours  CXR:  1/16 >>> Bilateral airspace disease  ASSESSMENT:  Acute on chronic  respiratory failure OSA / OHS Tracheostomy status ( 1/15) No overt  systolic congestive heart failure ( 1/16 EF 65% ) Possible diastolic dysfunction   PLAN: Continuous mechanical support Wean as tolerated Diuresis?   I have personally obtained a history, examined the patient, evaluated laboratory and imaging results, formulated the assessment and plan and placed orders.  Doree Fudge, MD Pulmonary and [place] Pager: [phone]  07/14/2013, 2:25 PM

## 2013-07-16 LAB — CBC
HCT: 30.5 % — ABNORMAL LOW (ref 36.0–46.0)
HEMOGLOBIN: 9.7 g/dL — AB (ref 12.0–15.0)
MCH: 29.6 pg (ref 26.0–34.0)
MCHC: 31.8 g/dL (ref 30.0–36.0)
MCV: 93 fL (ref 78.0–100.0)
PLATELETS: 242 10*3/uL (ref 150–400)
RBC: 3.28 MIL/uL — ABNORMAL LOW (ref 3.87–5.11)
RDW: 15.8 % — ABNORMAL HIGH (ref 11.5–15.5)
WBC: 7.7 10*3/uL (ref 4.0–10.5)

## 2013-07-16 LAB — BASIC METABOLIC PANEL
BUN: 52 mg/dL — AB (ref 6–23)
CALCIUM: 9.3 mg/dL (ref 8.4–10.5)
CO2: 29 mEq/L (ref 19–32)
Chloride: 108 mEq/L (ref 96–112)
Creatinine, Ser: 1.31 mg/dL — ABNORMAL HIGH (ref 0.50–1.10)
GFR calc Af Amer: 45 mL/min — ABNORMAL LOW (ref >90)
GFR, EST NON AFRICAN AMERICAN: 39 mL/min — AB (ref >90)
Glucose, Bld: 145 mg/dL — ABNORMAL HIGH (ref 70–99)
Potassium: 4.3 mEq/L (ref 3.7–5.3)
Sodium: 148 mEq/L — ABNORMAL HIGH (ref 137–147)

## 2013-07-16 NOTE — Progress Notes (Signed)
PULMONARY  / CRITICAL CARE MEDICINE  Name: Lori Burnett MRN: [id number] DOB: 1936-04-12  BRIEF PATIENT DESCRIPTION:  [name] artery bypass grafting on 12/22. Postop course notable for difficult wean, atrial fibrillation,UTI progressive deconditioning, and almost continuous BiPAP dependence. Transferred to [facility] on 1/9 for further supportive care.   SIGNIFICANT EVENTS / STUDIES:  12/22  CABG   1/15    Tracheostomy  1/20     EGD yesterday - healing duodenal bulb ulcer  LINES / TUBES:  OETT 1/10 >>> 1/15 Darlina Rumpf) >>>   CULTURES:  1/9   Urine >>> E. COLI 1/10 Respiratory >>>  Rare candida   ANTIBIOTICS:  Levaquin 1/7 >>> ??? Roc 1/14>>   SUBJECTIVE: No issues  PHYSICAL EXAMINATION:   Vital signs revived   General: Morbidly obese African American female currently sedated on vent . Very deconditioned  Neuro: Awake, oriented, profoundly weak  HEENT: Neck is large, unable to appreciate jugular venous distention, short neck. Trach #6 intact  Cardiovascular: distant  Lungs: diminished  Abdomen: Obese and without organomegaly  Musculoskeletal: Intact  Skin: intact  LABS:  Recent Labs Lab 07/10/13 0900 07/11/13 0815 07/13/13 0443 07/13/13 0837 07/14/13 0351 07/14/13 0628 07/16/13 0515  HGB 8.3* 9.7*  --  10.2*  --  9.8* 9.7*  WBC 8.6 10.9*  --  9.4  --  8.0 7.7  PLT 290 294  --  280  --  278 242  NA 141 143  --   --   --  144 148*  K 3.4* 3.8  --   --   --  4.4 4.3  CL 96 100  --   --   --  102 108  CO2 32 29  --   --   --  29 29  GLUCOSE 106* 140*  --   --   --  168* 145*  BUN 35* 32*  --   --   --  44* 52*  CREATININE 1.66* 1.54*  --   --   --  1.36* 1.31*  CALCIUM 8.7 9.2  --   --   --  9.1 9.3  MG 2.1  --   --   --   --  2.4  --   PHOS 3.6  --   --   --   --  3.6  --   AST 25 28  --   --   --  18  --   ALT 8 10  --   --   --  8  --    ALKPHOS 75 81  --   --   --  77  --   BILITOT 0.5 0.7  --   --   --  0.5  --   PROT 5.5* 5.8*  --   --   --  5.6*  --   ALBUMIN 2.1* 2.2*  --   --   --  2.1*  --   PHART  --   --  7.468*  --  7.457*  --   --   PCO2ART  --   --  43.3  --  44.3  --   --   PO2ART  --   --  118.0*  --  122.0*  --   --    No results found for this basename: GLUCAP,  in the last 168 hours  CXR:  1/16 >>> Bilateral airspace  disease  ASSESSMENT:  Acute on chronic respiratory failure OSA / OHS Tracheostomy status ( 8/29) No overt systolic congestive heart failure ( 1/16 EF 65% ) Possible diastolic dysfunction   PLAN: Continuous mechanical support Wean as tolerated Diuresis for goal negative fluid balance  I have personally obtained a history, examined the patient, evaluated laboratory and imaging results, formulated the assessment and plan and placed orders.  Doree Fudge, MD Pulmonary and [place] Pager: [phone]  07/16/2013, 12:27 PM

## 2013-07-18 ENCOUNTER — Other Ambulatory Visit (HOSPITAL_COMMUNITY): Payer: Self-pay

## 2013-07-18 DIAGNOSIS — J90 Pleural effusion, not elsewhere classified: Secondary | ICD-10-CM

## 2013-07-18 NOTE — Progress Notes (Addendum)
PULMONARY  / CRITICAL CARE MEDICINE  Name: Lori Burnett MRN: [id number] DOB: Jul 29, 1935  BRIEF PATIENT DESCRIPTION:  [name] artery bypass grafting on 12/22. Postop course notable for difficult wean, atrial fibrillation,UTI progressive deconditioning, and almost continuous BiPAP dependence. Transferred to [facility] on 1/9 for further supportive care.   SIGNIFICANT EVENTS / STUDIES:  12/22 CABG   1/15     Tracheostomy Jacinto Reap ) 1/16     TTE >>> EF 60% 1/20     EGD >>> Healing duodenal bulb ulcer  CULTURES:  1/9   Urine >>> E. COLI 1/10 Respiratory >>>  Rare Candida   ANTIBIOTICS:   SUBJECTIVE:  No issues  PHYSICAL EXAMINATION:   Vital signs revived   General: Morbidly obese, synchronous Neuro: Awake, non verbal, follows commnds HEENT: Tracheostomy intact Cardiovascular: Regular, distant, no murmurs Lungs: Diminished bilateral air entry Abdomen: Obese, sift Musculoskeletal: Intact  Skin: intact  LABS:  Recent Labs Lab 07/13/13 0443 07/13/13 0837 07/14/13 0351 07/14/13 0628 07/16/13 0515  HGB  --  10.2*  --  9.8* 9.7*  WBC  --  9.4  --  8.0 7.7  PLT  --  280  --  278 242  NA  --   --   --  144 148*  K  --   --   --  4.4 4.3  CL  --   --   --  102 108  CO2  --   --   --  29 29  GLUCOSE  --   --   --  168* 145*  BUN  --   --   --  44* 52*  CREATININE  --   --   --  1.36* 1.31*  CALCIUM  --   --   --  9.1 9.3  MG  --   --   --  2.4  --   PHOS  --   --   --  3.6  --   AST  --   --   --  18  --   ALT  --   --   --  8  --   ALKPHOS  --   --   --  77  --   BILITOT  --   --   --  0.5  --   PROT  --   --   --  5.6*  --   ALBUMIN  --   --   --  2.1*  --   PHART 7.468*  --  7.457*  --   --   PCO2ART 43.3  --  44.3  --   --   PO2ART 118.0*  --  122.0*  --   --    No results found for this basename: GLUCAP,  in the last 168 hours  CXR:  1/30 >>>   Bilateral effusions, no overt infiltrate  ASSESSMENT:  Acute on chronic respiratory failure OSA / OHS Tracheostomy status ( 8/56) No overt systolic congestive heart failure, but possible diastolic dysfunction  Bilateral pleural effusions ( new )  PLAN: Continuous mechanical support Wean as tolerated Diuresis for goal negative fluid balance No indications for thoracentesis at this time  I have personally obtained a history, examined the patient, evaluated laboratory and imaging results, formulated the assessment and plan and placed orders.  Doree Fudge, MD Pulmonary and [place] Pager: [phone]  07/18/2013, 10:31 AM

## 2013-07-20 ENCOUNTER — Other Ambulatory Visit (HOSPITAL_COMMUNITY): Payer: Self-pay

## 2013-07-20 LAB — CBC
HCT: 31.1 % — ABNORMAL LOW (ref 36.0–46.0)
Hemoglobin: 9.7 g/dL — ABNORMAL LOW (ref 12.0–15.0)
MCH: 28.9 pg (ref 26.0–34.0)
MCHC: 31.2 g/dL (ref 30.0–36.0)
MCV: 92.6 fL (ref 78.0–100.0)
Platelets: 212 10*3/uL (ref 150–400)
RBC: 3.36 MIL/uL — AB (ref 3.87–5.11)
RDW: 15.3 % (ref 11.5–15.5)
WBC: 8.6 10*3/uL (ref 4.0–10.5)

## 2013-07-20 LAB — BASIC METABOLIC PANEL
BUN: 41 mg/dL — ABNORMAL HIGH (ref 6–23)
CALCIUM: 9 mg/dL (ref 8.4–10.5)
CO2: 26 mEq/L (ref 19–32)
Chloride: 110 mEq/L (ref 96–112)
Creatinine, Ser: 1.11 mg/dL — ABNORMAL HIGH (ref 0.50–1.10)
GFR calc Af Amer: 55 mL/min — ABNORMAL LOW (ref >90)
GFR, EST NON AFRICAN AMERICAN: 47 mL/min — AB (ref >90)
GLUCOSE: 168 mg/dL — AB (ref 70–99)
Potassium: 5.1 mEq/L (ref 3.7–5.3)
SODIUM: 146 meq/L (ref 137–147)

## 2013-07-21 NOTE — Progress Notes (Signed)
PULMONARY  / CRITICAL CARE MEDICINE  Name: Lori Burnett MRN: [id number] DOB: 1936/06/09  BRIEF PATIENT DESCRIPTION:  [name] [facility] post [facility] artery bypass grafting on 12/22. Postop course notable for difficult wean, atrial fibrillation,UTI progressive deconditioning, and almost continuous BiPAP dependence. Transferred to [facility] on 1/9 for further supportive care.   SIGNIFICANT EVENTS / STUDIES:  12/22 CABG   1/15     Tracheostomy Jacinto Reap ) 1/16     TTE >>> EF 60% 1/20     EGD >>> Healing duodenal bulb ulcer  CULTURES:  1/9   Urine >>> E. COLI 1/10 Respiratory >>>  Rare Candida   ANTIBIOTICS:   SUBJECTIVE:  No issues  PHYSICAL EXAMINATION:   Vital signs revived   General: Morbidly obese, TC Neuro: Awake, non verbal, follows commnds HEENT: Tracheostomy intact Cardiovascular: Regular, distant, no murmurs Lungs: Diminished bilateral air entry Abdomen: Obese, sift Musculoskeletal: Intact  Skin: intact  LABS:  Recent Labs Lab 07/16/13 0515 07/20/13 0845  HGB 9.7* 9.7*  WBC 7.7 8.6  PLT 242 212  NA 148* 146  K 4.3 5.1  CL 108 110  CO2 29 26  GLUCOSE 145* 168*  BUN 52* 41*  CREATININE 1.31* 1.11*  CALCIUM 9.3 9.0   No results found for this basename: GLUCAP,  in the last 168 hours  CXR:  1/30 >>>  Bilateral effusions, no overt infiltrate  ASSESSMENT:  Acute on chronic respiratory failure OSA / OHS Tracheostomy status ( 9/79) No overt systolic congestive heart failure, but possible diastolic dysfunction  Bilateral pleural effusions ( new )  PLAN: - On TC for two hours today but will attempt to keep until night as patient is doing very well. - Wean as tolerated. - Diuresis for goal negative fluid balance. - No indications for thoracentesis at this time as likely transudative and will recur and amount is not impressive.  I have personally obtained a  history, examined the patient, evaluated laboratory and imaging results, formulated the assessment and plan and placed orders.  Rush Farmer, M.D. [facility] Pulmonary/Critical Care Medicine. Pager: [phone]. After hours pager: [phone].

## 2013-07-22 ENCOUNTER — Other Ambulatory Visit (HOSPITAL_COMMUNITY): Payer: Self-pay

## 2013-07-22 LAB — BASIC METABOLIC PANEL
BUN: 40 mg/dL — ABNORMAL HIGH (ref 6–23)
CO2: 27 mEq/L (ref 19–32)
Calcium: 9.3 mg/dL (ref 8.4–10.5)
Chloride: 108 mEq/L (ref 96–112)
Creatinine, Ser: 1.16 mg/dL — ABNORMAL HIGH (ref 0.50–1.10)
GFR calc non Af Amer: 45 mL/min — ABNORMAL LOW (ref >90)
GFR, EST AFRICAN AMERICAN: 52 mL/min — AB (ref >90)
Glucose, Bld: 157 mg/dL — ABNORMAL HIGH (ref 70–99)
POTASSIUM: 4.6 meq/L (ref 3.7–5.3)
SODIUM: 148 meq/L — AB (ref 137–147)

## 2013-07-22 LAB — CBC
HCT: 28.8 % — ABNORMAL LOW (ref 36.0–46.0)
Hemoglobin: 9.2 g/dL — ABNORMAL LOW (ref 12.0–15.0)
MCH: 29.6 pg (ref 26.0–34.0)
MCHC: 31.9 g/dL (ref 30.0–36.0)
MCV: 92.6 fL (ref 78.0–100.0)
PLATELETS: 190 10*3/uL (ref 150–400)
RBC: 3.11 MIL/uL — ABNORMAL LOW (ref 3.87–5.11)
RDW: 15.3 % (ref 11.5–15.5)
WBC: 7.9 10*3/uL (ref 4.0–10.5)

## 2013-07-22 LAB — BLOOD GAS, ARTERIAL
Acid-Base Excess: 4.1 mmol/L — ABNORMAL HIGH (ref 0.0–2.0)
Bicarbonate: 27.7 mEq/L — ABNORMAL HIGH (ref 20.0–24.0)
FIO2: 0.28 %
O2 Saturation: 98.7 %
PATIENT TEMPERATURE: 98.6
PEEP: 5 cmH2O
RATE: 22 resp/min
TCO2: 28.9 mmol/L (ref 0–100)
VT: 430 mL
pCO2 arterial: 38.7 mmHg (ref 35.0–45.0)
pH, Arterial: 7.469 — ABNORMAL HIGH (ref 7.350–7.450)
pO2, Arterial: 109 mmHg — ABNORMAL HIGH (ref 80.0–100.0)

## 2013-07-23 DIAGNOSIS — Z930 Tracheostomy status: Secondary | ICD-10-CM

## 2013-07-23 LAB — BASIC METABOLIC PANEL
BUN: 39 mg/dL — ABNORMAL HIGH (ref 6–23)
CALCIUM: 9.3 mg/dL (ref 8.4–10.5)
CO2: 23 mEq/L (ref 19–32)
Chloride: 106 mEq/L (ref 96–112)
Creatinine, Ser: 1.25 mg/dL — ABNORMAL HIGH (ref 0.50–1.10)
GFR calc Af Amer: 48 mL/min — ABNORMAL LOW (ref >90)
GFR calc non Af Amer: 41 mL/min — ABNORMAL LOW (ref >90)
GLUCOSE: 126 mg/dL — AB (ref 70–99)
Potassium: 5 mEq/L (ref 3.7–5.3)
Sodium: 143 mEq/L (ref 137–147)

## 2013-07-23 NOTE — Progress Notes (Signed)
PULMONARY  / CRITICAL CARE MEDICINE  Name: Lori Burnett MRN: [id number] DOB: 1935/12/26  BRIEF PATIENT DESCRIPTION:  [name] post [facility] artery bypass grafting on 12/22. Postop course notable for difficult wean, atrial fibrillation,UTI progressive deconditioning, and almost continuous BiPAP dependence. Transferred to [facility] on 1/9 for further supportive care.   SIGNIFICANT EVENTS / STUDIES:  12/22 CABG   1/15     Tracheostomy Jacinto Reap ) 1/16     TTE >>> EF 60% 1/20     EGD >>> Healing duodenal bulb ulcer  CULTURES:  1/9   Urine >>> E. COLI 1/10 Respiratory >>>  Rare Candida   ANTIBIOTICS: Per primary  SUBJECTIVE:  No issues  PHYSICAL EXAMINATION:   Vital signs revived   General: Morbidly obese, TC Neuro: Awake, non verbal, follows commnds HEENT: Tracheostomy intact Cardiovascular: Regular, distant, no murmurs Lungs: Diminished bilateral air entry Abdomen: Obese, sift Musculoskeletal: Intact  Skin: intact  LABS:  Recent Labs Lab 07/20/13 0845 07/22/13 0456 07/22/13 0733 07/23/13 0405  HGB 9.7*  --  9.2*  --   WBC 8.6  --  7.9  --   PLT 212  --  190  --   NA 146  --  148* 143  K 5.1  --  4.6 5.0  CL 110  --  108 106  CO2 26  --  27 23  GLUCOSE 168*  --  157* 126*  BUN 41*  --  40* 39*  CREATININE 1.11*  --  1.16* 1.25*  CALCIUM 9.0  --  9.3 9.3  PHART  --  7.469*  --   --   PCO2ART  --  38.7  --   --   PO2ART  --  109.0*  --   --    No results found for this basename: GLUCAP,  in the last 168 hours  CXR:  1/30 >>>  Bilateral effusions, no overt infiltrate  ASSESSMENT:  Acute on chronic respiratory failure OSA / OHS Tracheostomy status ( 8/67) No overt systolic congestive heart failure, but possible diastolic dysfunction  Bilateral pleural effusions ( new )  PLAN: - On TC now but failed yesterday when was laid flat and had to be placed back on  the vent, will maintain on TC as tolerated. - Wean as tolerated. - Diuresis for goal negative fluid balance. - No indications for thoracentesis at this time as likely transudative and will recur and amount is not impressive.  I have personally obtained a history, examined the patient, evaluated laboratory and imaging results, formulated the assessment and plan and placed orders.  Rush Farmer, M.D. [facility] Pulmonary/Critical Care Medicine. Pager: [phone]. After hours pager: [phone].

## 2013-07-25 ENCOUNTER — Other Ambulatory Visit (HOSPITAL_COMMUNITY): Payer: Self-pay

## 2013-07-25 LAB — BASIC METABOLIC PANEL
BUN: 35 mg/dL — AB (ref 6–23)
CALCIUM: 8.9 mg/dL (ref 8.4–10.5)
CO2: 26 meq/L (ref 19–32)
CREATININE: 1.26 mg/dL — AB (ref 0.50–1.10)
Chloride: 103 mEq/L (ref 96–112)
GFR calc Af Amer: 47 mL/min — ABNORMAL LOW (ref >90)
GFR calc non Af Amer: 41 mL/min — ABNORMAL LOW (ref >90)
Glucose, Bld: 134 mg/dL — ABNORMAL HIGH (ref 70–99)
Potassium: 3.9 mEq/L (ref 3.7–5.3)
Sodium: 142 mEq/L (ref 137–147)

## 2013-07-25 LAB — CBC
HEMATOCRIT: 28.8 % — AB (ref 36.0–46.0)
Hemoglobin: 9.3 g/dL — ABNORMAL LOW (ref 12.0–15.0)
MCH: 28.8 pg (ref 26.0–34.0)
MCHC: 32.3 g/dL (ref 30.0–36.0)
MCV: 89.2 fL (ref 78.0–100.0)
PLATELETS: 209 10*3/uL (ref 150–400)
RBC: 3.23 MIL/uL — AB (ref 3.87–5.11)
RDW: 14.9 % (ref 11.5–15.5)
WBC: 8.6 10*3/uL (ref 4.0–10.5)

## 2013-07-25 NOTE — Progress Notes (Signed)
PULMONARY  / CRITICAL CARE MEDICINE  Name: Lori Burnett MRN: [id number] DOB: [birth year]  BRIEF PATIENT DESCRIPTION:  [name] artery bypass grafting on 12/22. Postop course notable for difficult wean, atrial fibrillation,UTI progressive deconditioning, and almost continuous BiPAP dependence. Transferred to [facility] on 1/9 for further supportive care.   SIGNIFICANT EVENTS / STUDIES:  12/22 CABG   1/15     Tracheostomy Jacinto Reap ) 1/16     TTE >>> EF 60% 1/20     EGD >>> Healing duodenal bulb ulcer  CULTURES:  1/9   Urine >>> E. COLI 1/10 Respiratory >>>  Rare Candida   ANTIBIOTICS: Per primary  SUBJECTIVE:  No issues  PHYSICAL EXAMINATION:   Vital signs revived   General: awake and alert on TC HEENT: Trache site c/d/i PULM: CTA B CV: RRR no mgr AB: BS+, soft, nontender Ext: some edema Neuro: Awake and alert  LABS:  Recent Labs Lab 07/20/13 0845 07/22/13 0456 07/22/13 0733 07/23/13 0405 07/25/13 0442  HGB 9.7*  --  9.2*  --  9.3*  WBC 8.6  --  7.9  --  8.6  PLT 212  --  190  --  209  NA 146  --  148* 143 142  K 5.1  --  4.6 5.0 3.9  CL 110  --  108 106 103  CO2 26  --  27 23 26   GLUCOSE 168*  --  157* 126* 134*  BUN 41*  --  40* 39* 35*  CREATININE 1.11*  --  1.16* 1.25* 1.26*  CALCIUM 9.0  --  9.3 9.3 8.9  PHART  --  7.469*  --   --   --   PCO2ART  --  38.7  --   --   --   PO2ART  --  109.0*  --   --   --    No results found for this basename: GLUCAP,  in the last 168 hours  CXR:  1/30 >>>  Bilateral effusions, no overt infiltrate  ASSESSMENT:  Acute on chronic respiratory failure> doing well with ATC daily OSA / OHS Tracheostomy status ( 1/66) No overt systolic congestive heart failure, but possible diastolic dysfunction  Bilateral pleural effusions but considering clinical improvement no indication for thoracentesis as  diuresing  PLAN: - continue ATC trials - Wean as tolerated. - Diuresis for goal negative fluid balance.  [name] PCCM Pager: [phone] Cell: [phone] If no response, call [phone]

## 2013-07-26 LAB — BASIC METABOLIC PANEL
BUN: 40 mg/dL — AB (ref 6–23)
CALCIUM: 8.7 mg/dL (ref 8.4–10.5)
CO2: 27 mEq/L (ref 19–32)
CREATININE: 1.36 mg/dL — AB (ref 0.50–1.10)
Chloride: 103 mEq/L (ref 96–112)
GFR calc Af Amer: 43 mL/min — ABNORMAL LOW (ref >90)
GFR, EST NON AFRICAN AMERICAN: 37 mL/min — AB (ref >90)
GLUCOSE: 210 mg/dL — AB (ref 70–99)
Potassium: 4.1 mEq/L (ref 3.7–5.3)
SODIUM: 143 meq/L (ref 137–147)

## 2013-07-27 ENCOUNTER — Other Ambulatory Visit (HOSPITAL_COMMUNITY): Payer: Self-pay

## 2013-07-28 ENCOUNTER — Other Ambulatory Visit (HOSPITAL_COMMUNITY): Payer: Self-pay

## 2013-07-28 NOTE — Progress Notes (Signed)
PULMONARY  / CRITICAL CARE MEDICINE  Name: Skyeler Scalese MRN: [id number] DOB: 07-08-1935  BRIEF PATIENT DESCRIPTION:  [name] post [facility] artery bypass grafting on 12/22. Postop course notable for difficult wean, atrial fibrillation,UTI progressive deconditioning, and almost continuous BiPAP dependence. Transferred to [facility] on 1/9 for further supportive care.   SIGNIFICANT EVENTS / STUDIES:  12/22 CABG   1/15     Tracheostomy Jacinto Reap ) 1/16     TTE >>> EF 60% 1/20     EGD >>> Healing duodenal bulb ulcer  CULTURES:  1/9   Urine >>> E. COLI 1/10 Respiratory >>>  Rare Candida   ANTIBIOTICS: Per primary  SUBJECTIVE:  No issues  PHYSICAL EXAMINATION:   Vital signs reviewed. Abnormal values will appear under impression plan section.     General: awake and alert on TC HEENT: Trache site c/d/i PULM: CTA B CV: RRR no mgr AB: BS+, soft, nontender Ext: some edema Neuro: Awake and alert  LABS:  Recent Labs Lab 07/22/13 0456  07/22/13 0733 07/23/13 0405 07/25/13 0442 07/26/13 0528  HGB  --   --  9.2*  --  9.3*  --   WBC  --   --  7.9  --  8.6  --   PLT  --   --  190  --  209  --   NA  --   < > 148* 143 142 143  K  --   < > 4.6 5.0 3.9 4.1  CL  --   < > 108 106 103 103  CO2  --   < > 27 23 26 27   GLUCOSE  --   < > 157* 126* 134* 210*  BUN  --   < > 40* 39* 35* 40*  CREATININE  --   < > 1.16* 1.25* 1.26* 1.36*  CALCIUM  --   < > 9.3 9.3 8.9 8.7  PHART 7.469*  --   --   --   --   --   PCO2ART 38.7  --   --   --   --   --   PO2ART 109.0*  --   --   --   --   --   < > = values in this interval not displayed. No results found for this basename: GLUCAP,  in the last 168 hours  Dg Chest Port 1 View  07/28/2013   CLINICAL DATA:  Respiratory failure.  EXAM: PORTABLE CHEST - 1 VIEW  COMPARISON:  07/27/2013  FINDINGS: Stable cardiomegaly and changes from previous CABG  surgery. Bilateral irregular interstitial densities in noted. Somewhat greater density in the left lung base is likely combination of atelectasis and a small effusion. Findings may reflect pneumonia. Consider persistent mild congestive heart failure.  Tracheostomy tube, right subclavian central venous line and right internal jugular central venous line are stable.  No pneumothorax.  IMPRESSION: 1. No change from the previous day's study. 2. Findings may reflect mild congestive heart failure versus left lung pneumonia.   Electronically Signed   By: Lajean Manes M.D.   On: 07/28/2013 07:28   Dg Chest Port 1 View  07/27/2013   CLINICAL DATA:  Tracheostomy.  Pneumonia.  Follow-up.  EXAM: PORTABLE CHEST - 1 VIEW  COMPARISON:  07/25/2013  FINDINGS: Tracheostomy appears unchanged, well positioned. Right-sided port and right subclavian central line appear unchanged. Cardiac silhouette remains enlarged. Pulmonary density persists in the left  lung consistent with pneumonia. No new findings. There is small amount of pleural fluid on the left.  IMPRESSION: Persistent infiltrate in the left lung with a small amount of pleural fluid. No new finding.   Electronically Signed   By: Nelson Chimes M.D.   On: 07/27/2013 14:01     ASSESSMENT:  Acute on chronic respiratory failure> doing well with ATC daily OSA / OHS Tracheostomy status ( 2/26) No overt systolic congestive heart failure, but possible diastolic dysfunction  Hyperglycemia  PLAN: - continue ATC trials, increase PV -consider to dc tpn and swallow test -if fail swallow conasider G to J tube in IR  - Diuresis for goal negative fluid balance. -hyperglycemia per primary  [name] ACNP [name] PCCM Pager [phone] till 3 pm If no answer page [phone] 07/28/2013, 1:19 PM  I have fully examined this patient and agree with above findings.    And edited in [place]. Titus Mould, MD, [place] Pgr: [facility]

## 2013-07-29 ENCOUNTER — Other Ambulatory Visit (HOSPITAL_COMMUNITY): Payer: Self-pay

## 2013-07-30 ENCOUNTER — Other Ambulatory Visit (HOSPITAL_COMMUNITY): Payer: Self-pay

## 2013-07-30 LAB — BLOOD GAS, ARTERIAL
Acid-Base Excess: 10.9 mmol/L — ABNORMAL HIGH (ref 0.0–2.0)
Bicarbonate: 35.6 mEq/L — ABNORMAL HIGH (ref 20.0–24.0)
FIO2: 0.3 %
O2 SAT: 95.8 %
PATIENT TEMPERATURE: 98.6
TCO2: 37.3 mmol/L (ref 0–100)
pCO2 arterial: 54.5 mmHg — ABNORMAL HIGH (ref 35.0–45.0)
pH, Arterial: 7.431 (ref 7.350–7.450)
pO2, Arterial: 74 mmHg — ABNORMAL LOW (ref 80.0–100.0)

## 2013-07-31 ENCOUNTER — Other Ambulatory Visit (HOSPITAL_COMMUNITY): Payer: Medicare Other

## 2013-07-31 LAB — CBC
HCT: 31.7 % — ABNORMAL LOW (ref 36.0–46.0)
HEMOGLOBIN: 10.1 g/dL — AB (ref 12.0–15.0)
MCH: 29 pg (ref 26.0–34.0)
MCHC: 31.9 g/dL (ref 30.0–36.0)
MCV: 91.1 fL (ref 78.0–100.0)
Platelets: 330 10*3/uL (ref 150–400)
RBC: 3.48 MIL/uL — ABNORMAL LOW (ref 3.87–5.11)
RDW: 15.3 % (ref 11.5–15.5)
WBC: 10.6 10*3/uL — ABNORMAL HIGH (ref 4.0–10.5)

## 2013-07-31 LAB — BASIC METABOLIC PANEL
BUN: 32 mg/dL — ABNORMAL HIGH (ref 6–23)
CO2: 36 mEq/L — ABNORMAL HIGH (ref 19–32)
CREATININE: 1.14 mg/dL — AB (ref 0.50–1.10)
Calcium: 9.4 mg/dL (ref 8.4–10.5)
Chloride: 102 mEq/L (ref 96–112)
GFR calc Af Amer: 53 mL/min — ABNORMAL LOW (ref >90)
GFR, EST NON AFRICAN AMERICAN: 46 mL/min — AB (ref >90)
Glucose, Bld: 105 mg/dL — ABNORMAL HIGH (ref 70–99)
Potassium: 3.5 mEq/L — ABNORMAL LOW (ref 3.7–5.3)
SODIUM: 149 meq/L — AB (ref 137–147)

## 2013-08-01 LAB — BASIC METABOLIC PANEL
BUN: 31 mg/dL — ABNORMAL HIGH (ref 6–23)
CALCIUM: 9.4 mg/dL (ref 8.4–10.5)
CO2: 38 mEq/L — ABNORMAL HIGH (ref 19–32)
CREATININE: 1.21 mg/dL — AB (ref 0.50–1.10)
Chloride: 100 mEq/L (ref 96–112)
GFR calc non Af Amer: 43 mL/min — ABNORMAL LOW (ref >90)
GFR, EST AFRICAN AMERICAN: 49 mL/min — AB (ref >90)
Glucose, Bld: 163 mg/dL — ABNORMAL HIGH (ref 70–99)
Potassium: 3.5 mEq/L — ABNORMAL LOW (ref 3.7–5.3)
Sodium: 145 mEq/L (ref 137–147)

## 2013-08-01 NOTE — Progress Notes (Signed)
PULMONARY  / CRITICAL CARE MEDICINE  Name: Lori Burnett MRN: [id number] DOB: Apr 22, 1936  BRIEF PATIENT DESCRIPTION:  [name] artery bypass grafting on 12/22. Postop course notable for difficult wean, atrial fibrillation,UTI progressive deconditioning, and almost continuous BiPAP dependence. Transferred to [facility] on 1/9 for further supportive care.   SIGNIFICANT EVENTS / STUDIES:  12/22 CABG   1/15     Tracheostomy Jacinto Reap ) 1/16     TTE >>> EF 60% 1/20     EGD >>> Healing duodenal bulb ulcer 2/13 planned trach change CULTURES:  1/9   Urine >>> E. COLI 1/10 Respiratory >>>  Rare Candida   ANTIBIOTICS: Per primary  SUBJECTIVE:  Some secretions  PHYSICAL EXAMINATION:   Vital signs reviewed. Abnormal values will appear under impression plan section.     General: awake and alert on TC HEENT: Trache site c/d/i PULM: CTA B CV: RRR no mgr AB: BS+, soft, nontender Ext: some edema Neuro: Awake and alert  LABS:  Recent Labs Lab 07/26/13 0528 07/30/13 1120 07/31/13 0645 07/31/13 0825 08/01/13 0620  HGB  --   --  10.1*  --   --   WBC  --   --  10.6*  --   --   PLT  --   --  330  --   --   NA 143  --   --  149* 145  K 4.1  --   --  3.5* 3.5*  CL 103  --   --  102 100  CO2 27  --   --  36* 38*  GLUCOSE 210*  --   --  105* 163*  BUN 40*  --   --  32* 31*  CREATININE 1.36*  --   --  1.14* 1.21*  CALCIUM 8.7  --   --  9.4 9.4  PHART  --  7.431  --   --   --   PCO2ART  --  54.5*  --   --   --   PO2ART  --  74.0*  --   --   --    No results found for this basename: GLUCAP,  in the last 168 hours  [place] Misc Soft Tissue  07/31/2013   CLINICAL DATA:  Left forearm swelling, evaluate for hematoma  EXAM: ULTRASOUND left UPPER EXTREMITY LIMITED  TECHNIQUE: Ultrasound examination of the upper extremity soft tissues was performed in the area of clinical concern.   COMPARISON:  None  FINDINGS: Targeted sonographic evaluation of the proximal forearm just distal to the antecubital fossa wall in the region of the abnormality demonstrates a 6.4 x 2.6 x 3.6 cm ovoid masslike soft tissue abnormality. The abnormality is slightly hypoechoic to the adjacent musculature and demonstrate internal hypoechoic striations. There is some evidence of internal vascularity on color Doppler. Overall, the sonographic appearance is most consistent with a focally edematous and enlarged muscle. No definite complex fluid collection to suggest focal hematoma or abscess.  IMPRESSION: The sonographic appearance of the abnormality in the upper forearm is most consistent with a focally edematous and enlarged muscular structure. This could represent intramuscular hematoma, muscle strain/sprain or focal myositis.  If further imaging evaluation is clinically warranted MRI would be the study of choice.   Electronically Signed   By: Jacqulynn Cadet M.D.   On: 07/31/2013 08:28     ASSESSMENT:  Acute on chronic respiratory failure> doing well with  ATC daily OSA / OHS Tracheostomy status ( 2/67) No overt systolic congestive heart failure, but possible diastolic dysfunction  Hyperglycemia  PLAN: - continue ATC trials, increase PMV further G  tube in IR  - Diuresis for goal negative fluid balance. -hyperglycemia per primary -would downsize to 6 cuffless , given secretions would NOT go to 4 at this stage -use xtra long  [name] ACNP [name] PCCM Pager [phone] till 3 pm If no answer page [phone] 08/01/2013, 1:10 PM  I have fully examined this patient and agree with above findings.    Lavon Paganini. Titus Mould, MD, [place] Pgr: [facility]

## 2013-08-03 LAB — BASIC METABOLIC PANEL
BUN: 32 mg/dL — ABNORMAL HIGH (ref 6–23)
CALCIUM: 9.5 mg/dL (ref 8.4–10.5)
CO2: 36 mEq/L — ABNORMAL HIGH (ref 19–32)
CREATININE: 1.17 mg/dL — AB (ref 0.50–1.10)
Chloride: 100 mEq/L (ref 96–112)
GFR calc non Af Amer: 44 mL/min — ABNORMAL LOW (ref >90)
GFR, EST AFRICAN AMERICAN: 51 mL/min — AB (ref >90)
Glucose, Bld: 168 mg/dL — ABNORMAL HIGH (ref 70–99)
Potassium: 3.2 mEq/L — ABNORMAL LOW (ref 3.7–5.3)
Sodium: 149 mEq/L — ABNORMAL HIGH (ref 137–147)

## 2013-08-03 LAB — CBC
HCT: 33.9 % — ABNORMAL LOW (ref 36.0–46.0)
Hemoglobin: 10.6 g/dL — ABNORMAL LOW (ref 12.0–15.0)
MCH: 28.6 pg (ref 26.0–34.0)
MCHC: 31.3 g/dL (ref 30.0–36.0)
MCV: 91.6 fL (ref 78.0–100.0)
PLATELETS: 348 10*3/uL (ref 150–400)
RBC: 3.7 MIL/uL — ABNORMAL LOW (ref 3.87–5.11)
RDW: 15.3 % (ref 11.5–15.5)
WBC: 8.8 10*3/uL (ref 4.0–10.5)

## 2013-08-04 LAB — BASIC METABOLIC PANEL
BUN: 33 mg/dL — ABNORMAL HIGH (ref 6–23)
CO2: 39 mEq/L — ABNORMAL HIGH (ref 19–32)
CREATININE: 1.19 mg/dL — AB (ref 0.50–1.10)
Calcium: 10 mg/dL (ref 8.4–10.5)
Chloride: 101 mEq/L (ref 96–112)
GFR calc non Af Amer: 44 mL/min — ABNORMAL LOW (ref >90)
GFR, EST AFRICAN AMERICAN: 50 mL/min — AB (ref >90)
Glucose, Bld: 133 mg/dL — ABNORMAL HIGH (ref 70–99)
POTASSIUM: 3.4 meq/L — AB (ref 3.7–5.3)
Sodium: 148 mEq/L — ABNORMAL HIGH (ref 137–147)

## 2013-08-04 NOTE — Progress Notes (Signed)
PULMONARY  / CRITICAL CARE MEDICINE  Name: Lori Burnett MRN: [id number] DOB: July 03, 1935  CONSULTATION DATE: 06/27/2013  BRIEF PATIENT DESCRIPTION: [name] artery [facility] graft on 12/22. Postop course was complicated by respiratory failure, atrial fibrillation,UTI, progressive deconditioning. Transferred to [facility] on 1/9.  CULTURES: No results found for this or any previous visit (from the past 720 hour(s)).  ANTIBIOTICS:  Reviewed    INTERVAL HISTORY: Difficulty tolerating PMV  PHYSICAL EXAMINATION:  Vital signs:  Reviewed General:  No respiratory distress, obese Neuro:  Awake, alert, following commands HEENT:  Tracheostomy site intact Cardiovascular:  RRR, no m/r/g Lungs:  Bilateral diminished air entry, no w/r/r Abdomen:  Obese, soft, nontender, bowel sounds diminished Musculoskeletal:  Moves all extremities, trace edema Skin:  No rash  LABS:  Recent Labs Lab 08/01/13 0620 08/03/13 0630 08/04/13 0622  NA 145 149* 148*  K 3.5* 3.2* 3.4*  CL 100 100 101  CO2 38* 36* 39*  BUN 31* 32* 33*  CREATININE 1.21* 1.17* 1.19*  GLUCOSE 163* 168* 133*    Recent Labs Lab 07/31/13 0645 08/03/13 0630  HGB 10.1* 10.6*  HCT 31.7* 33.9*  WBC 10.6* 8.8  PLT 330 348   IMAGING: No results found.  ASSESSMENT: Acute on chronic respiratory failure Suspected acute on chronic diastolic heart failure Morbid obesity OHS / OSA Tracheostomy status  PLAN: Trach collar as tolerated Change to cuffless XLT#6, do NOT downsize Continue PMV attempts Aim for neutral to negative fluid balance  I have personally obtained a history, examined the patient, evaluated laboratory and imaging results, formulated the assessment and plan and placed orders.  Doree Fudge, MD Pulmonary and [place] Pager: [phone]  08/04/2013, 11:18 AM

## 2013-08-05 LAB — BASIC METABOLIC PANEL
BUN: 34 mg/dL — ABNORMAL HIGH (ref 6–23)
CALCIUM: 10.2 mg/dL (ref 8.4–10.5)
CO2: 37 meq/L — AB (ref 19–32)
Chloride: 107 mEq/L (ref 96–112)
Creatinine, Ser: 1.18 mg/dL — ABNORMAL HIGH (ref 0.50–1.10)
GFR calc non Af Amer: 44 mL/min — ABNORMAL LOW (ref >90)
GFR, EST AFRICAN AMERICAN: 51 mL/min — AB (ref >90)
Glucose, Bld: 138 mg/dL — ABNORMAL HIGH (ref 70–99)
Potassium: 4.4 mEq/L (ref 3.7–5.3)
SODIUM: 154 meq/L — AB (ref 137–147)

## 2013-08-06 LAB — BASIC METABOLIC PANEL
BUN: 38 mg/dL — ABNORMAL HIGH (ref 6–23)
CALCIUM: 9.8 mg/dL (ref 8.4–10.5)
CO2: 36 mEq/L — ABNORMAL HIGH (ref 19–32)
Chloride: 100 mEq/L (ref 96–112)
Creatinine, Ser: 1.18 mg/dL — ABNORMAL HIGH (ref 0.50–1.10)
GFR, EST AFRICAN AMERICAN: 51 mL/min — AB (ref >90)
GFR, EST NON AFRICAN AMERICAN: 44 mL/min — AB (ref >90)
GLUCOSE: 164 mg/dL — AB (ref 70–99)
POTASSIUM: 4.3 meq/L (ref 3.7–5.3)
SODIUM: 140 meq/L (ref 137–147)

## 2013-09-26 NOTE — Anesthesia Postprocedure Evaluation (Signed)
  Anesthesia Post-op Note  Patient: Lori Burnett  Procedure(s) Performed: Procedure(s): ESOPHAGOGASTRODUODENOSCOPY (EGD) (N/A)  Patient Location: PACU  Anesthesia Type:General  Level of Consciousness: awake, alert  and oriented  Airway and Oxygen Therapy: Patient Spontanous Breathing  Post-op Pain: none  Post-op Assessment: Post-op Vital signs reviewed, Patient's Cardiovascular Status Stable, Respiratory Function Stable, Patent Airway and Pain level controlled  Post-op Vital Signs: stable  Last Vitals:  Filed Vitals:   07/14/13 1200  BP: 107/47  Pulse:   Temp:   Resp: 28    Complications: No apparent anesthesia complications

## 2015-02-27 IMAGING — CR DG CHEST 1V PORT
1 series · 1 of 1 positions shown · non-contrast
Comparison: None.

CLINICAL DATA: COPD, respiratory failure

EXAM:
PORTABLE CHEST - 1 VIEW

[AP]
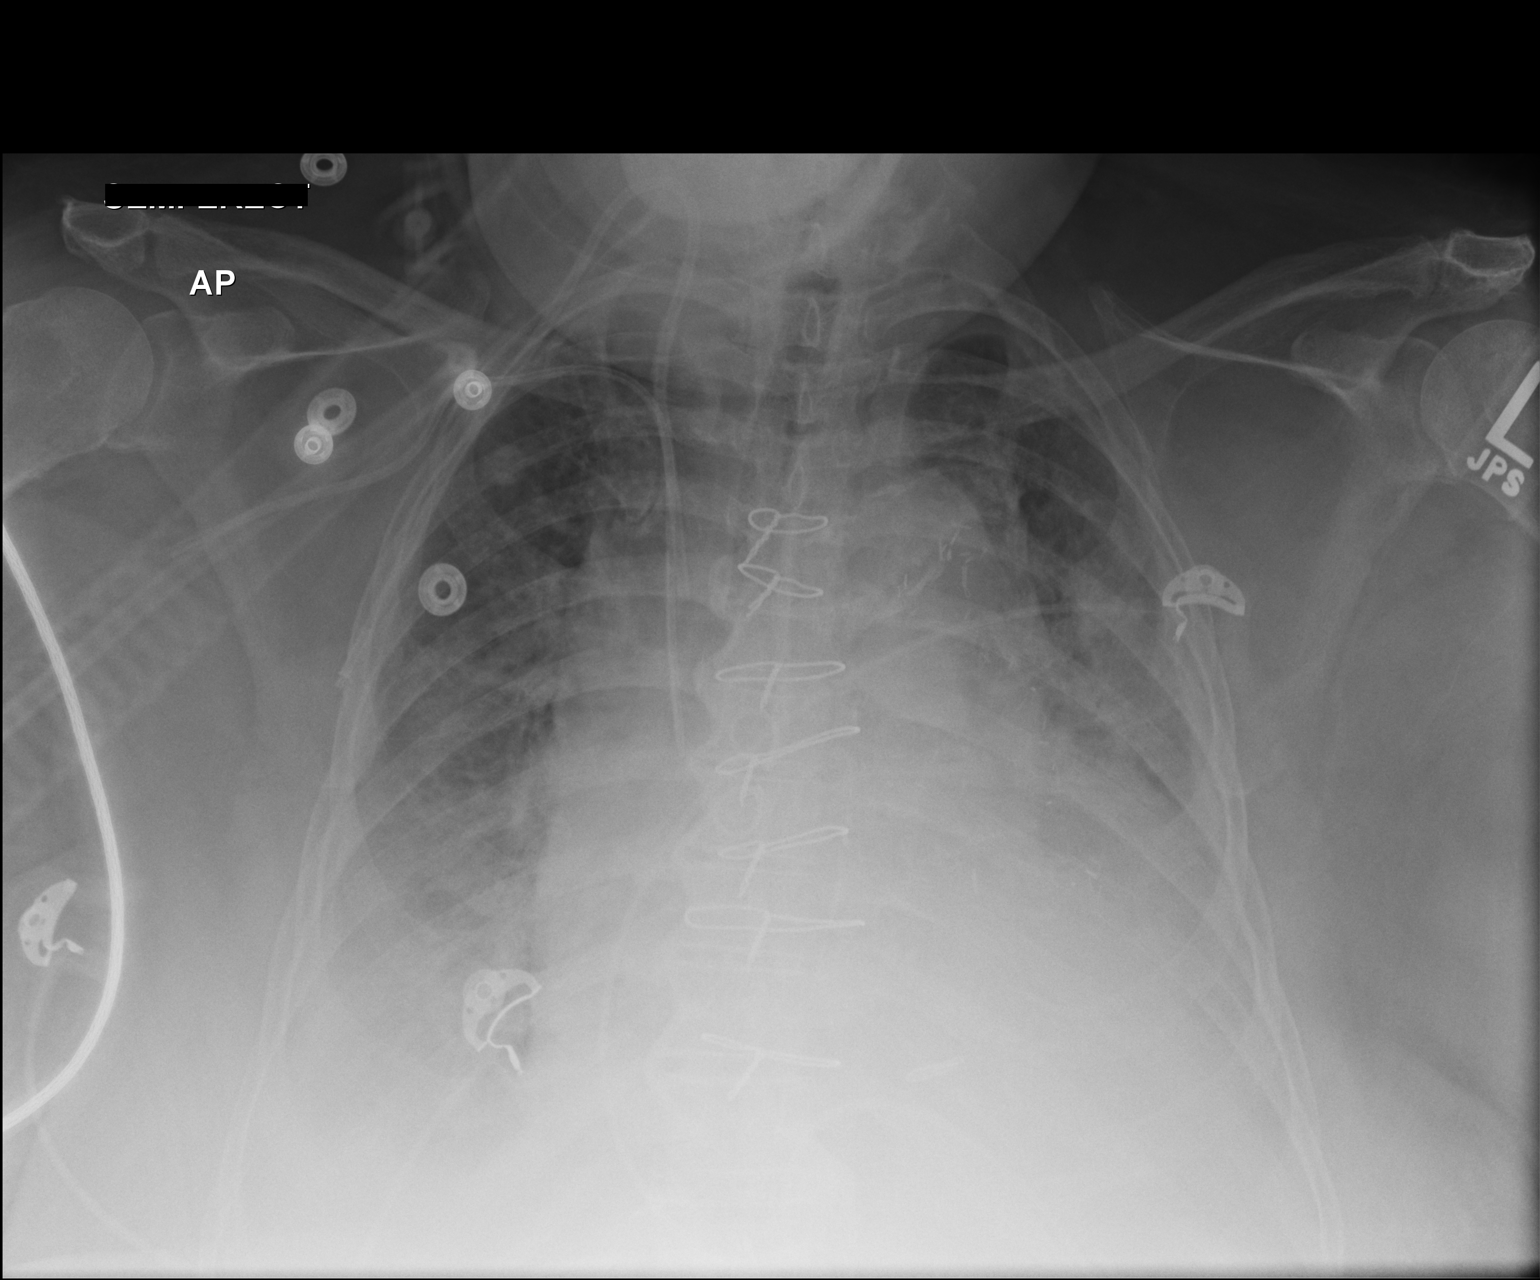

[1 of 1 positions shown; findings below may reference images not displayed]

FINDINGS: Right IJ and right subclavian central venous catheters are present.
The right IJ catheter terminates in the mid SVC in the right
subclavian catheter projects over the distal SVC. Enlargement of the
cardiac and mediastinal contours favored to reflect a combination of
cardiomegaly, vascular congestion and portable frontal technique.
Patient is status post median sternotomy with evidence of
multivessel CABG including [REDACTED] bypass. There is pulmonary vascular
congestion with a moderate pulmonary edema. Bilateral layering
pleural effusions and associated bibasilar opacities favored to
reflect pleural fluid with atelectasis.
IMPRESSION: 1. Overall findings are most consistent with moderate CHF.
2. Enlargement of the cardiopericardial silhouette may reflect
cardiomegaly and/ pericardial effusion.
3. Bilateral interstitial and airspace opacities favored to reflect
moderate pulmonary edema.
4. Bibasilar opacities likely reflecting layering pleural effusions
and atelectasis. In the appropriate clinical setting, superimposed
infection is difficult to exclude entirely.
5. Right IJ and subclavian central venous catheters with their tips
projecting over the mid and lower SVC respectively.

## 2015-02-28 IMAGING — CR DG CHEST 1V PORT
1 series · 1 of 1 positions shown · non-contrast
Comparison: 06/27/2013

CLINICAL DATA: Respiratory failure.

EXAM:
PORTABLE CHEST - 1 VIEW

[AP]
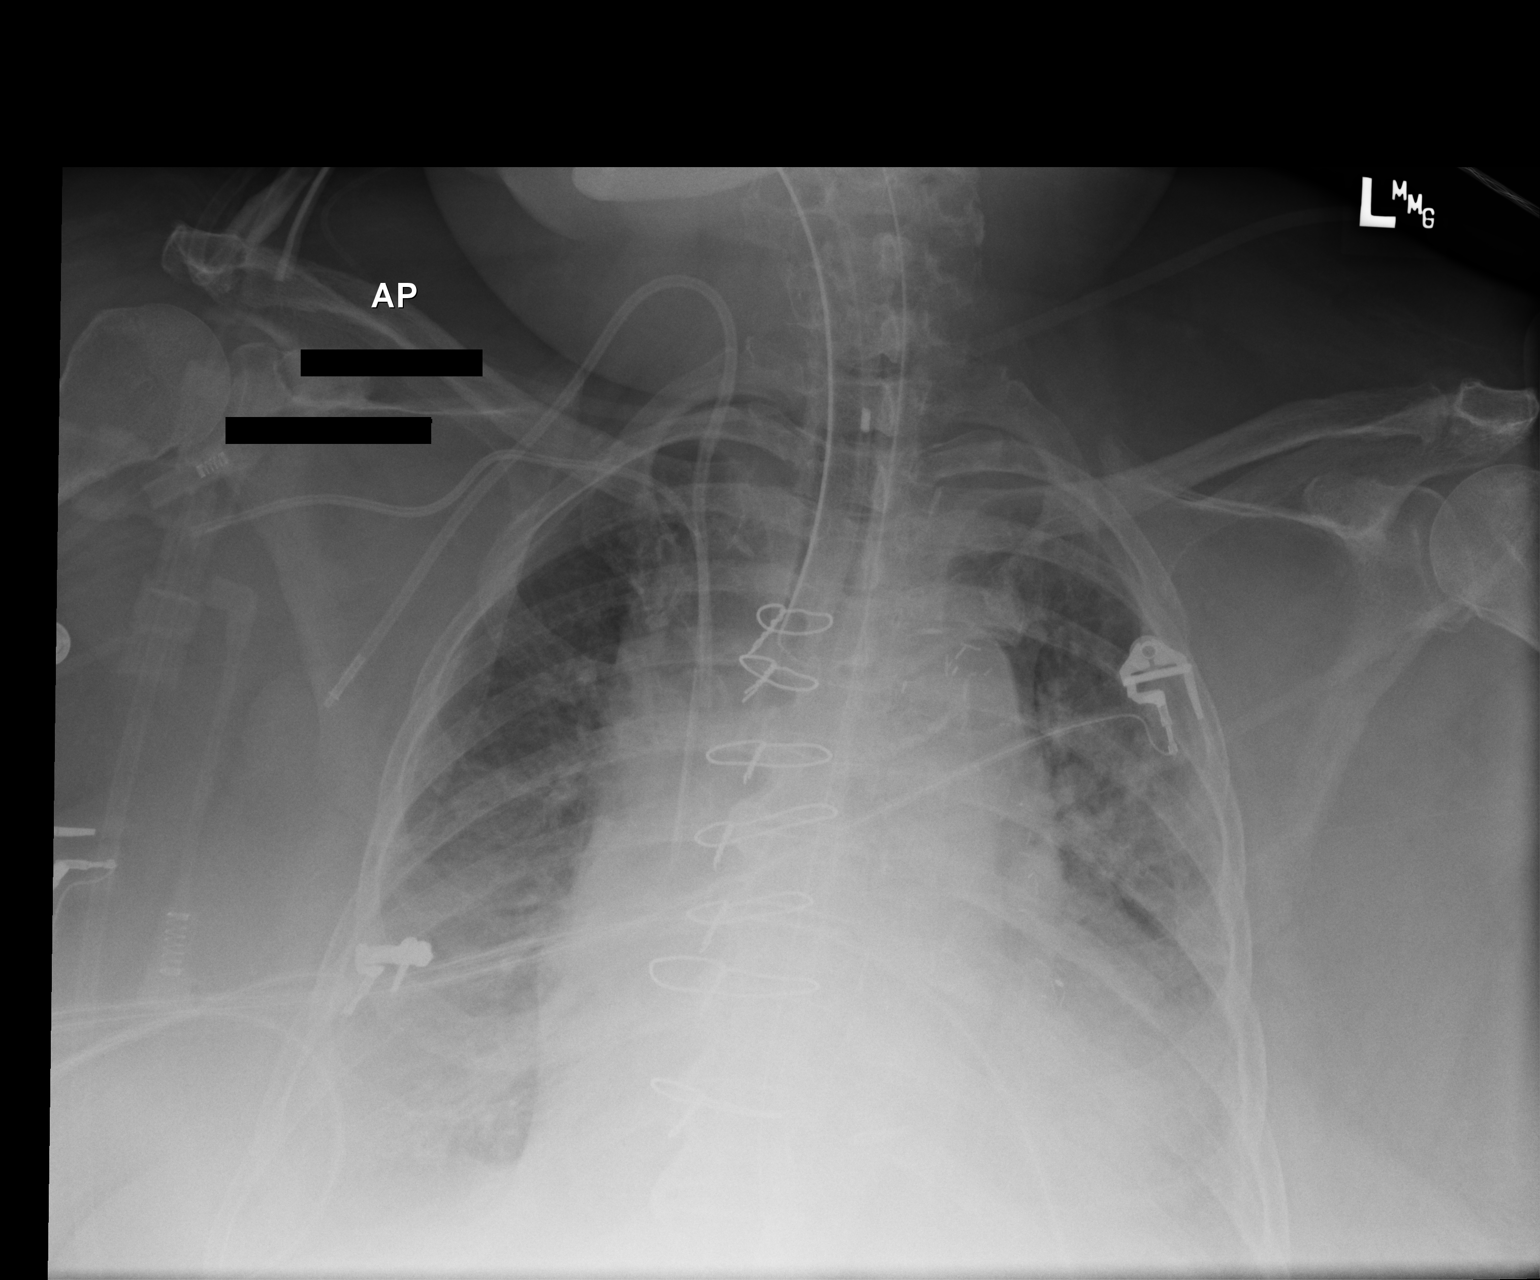

[1 of 1 positions shown; findings below may reference images not displayed]

FINDINGS: Endotracheal tube tip is approximately 2 cm above the carina.
Nasogastric tube extends below the diaphragm. Central line and port
positioning are stable. Lungs show stable pattern of CHF as well as
probable small bilateral pleural effusions. There is stable
cardiomegaly.
IMPRESSION: Stable CHF and probable small bilateral pleural effusions.

## 2015-03-02 IMAGING — CR DG CHEST 1V PORT
1 series · 1 of 1 positions shown · non-contrast
Comparison: 06/28/2013

CLINICAL DATA: Respiratory failure.

EXAM:
PORTABLE CHEST - 1 VIEW

[AP]
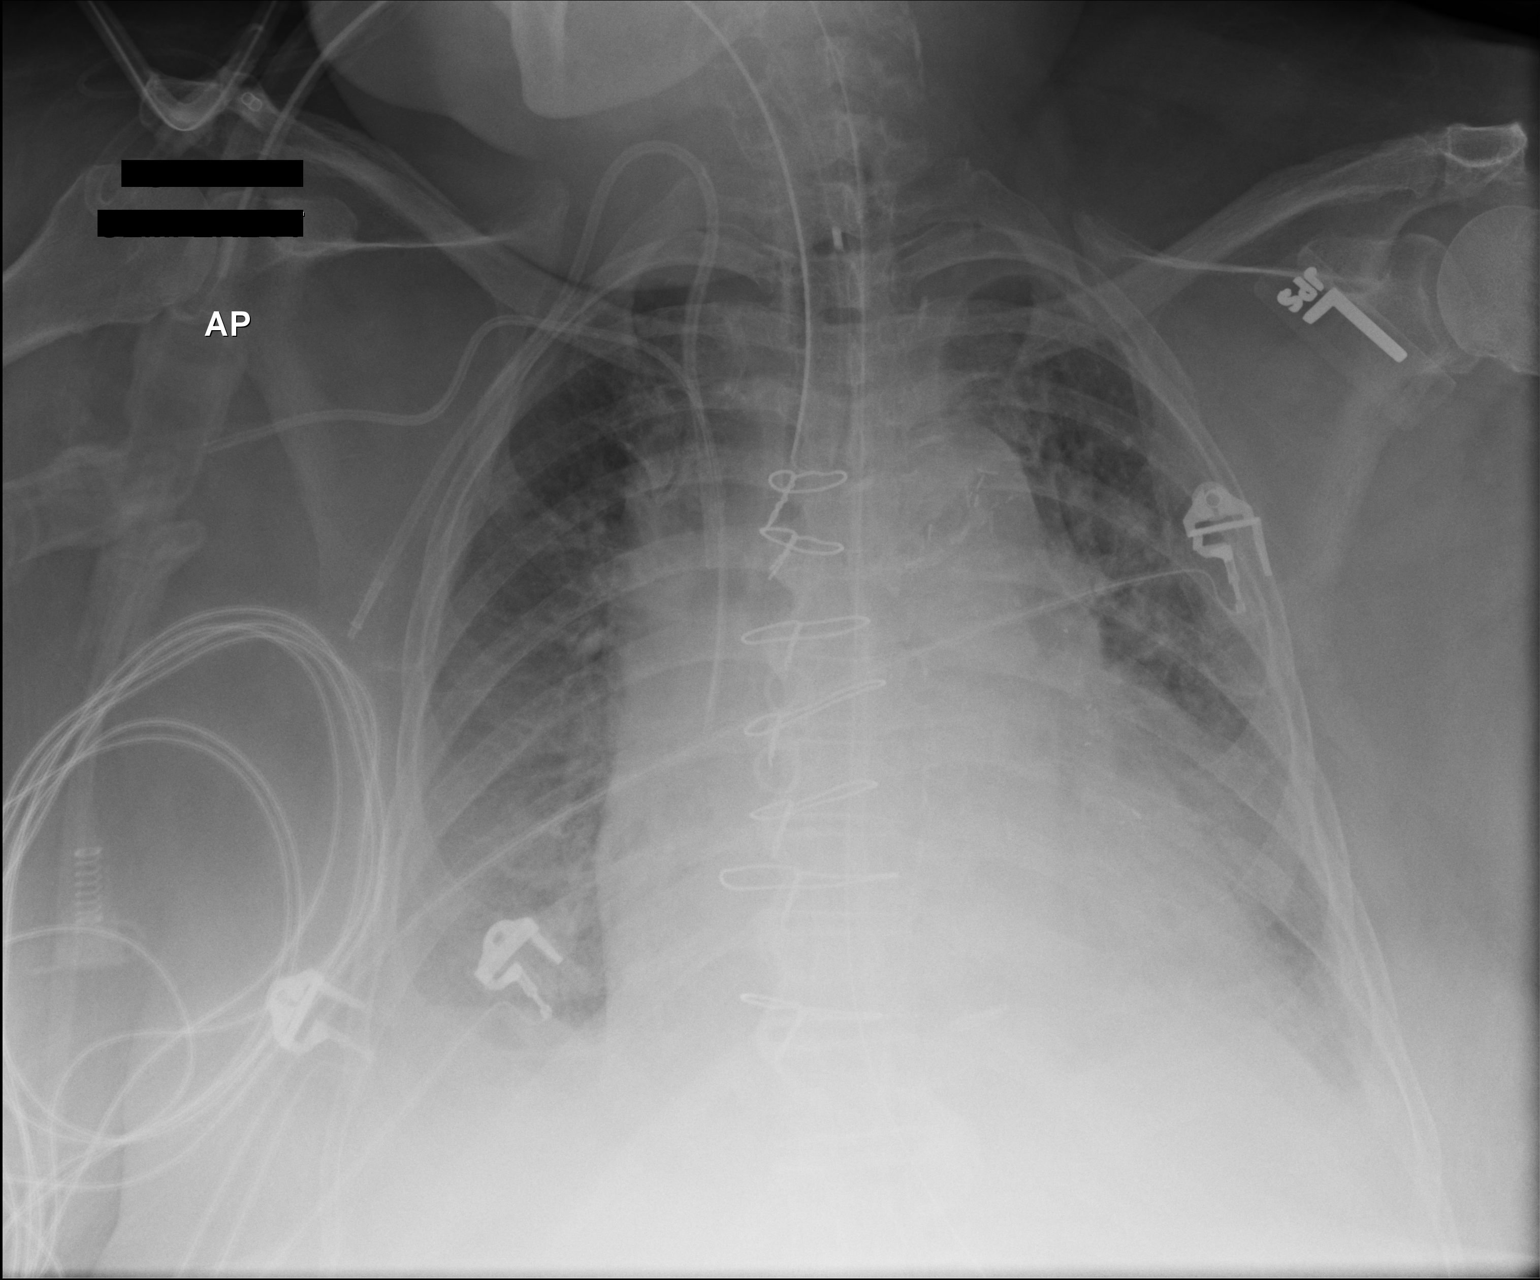

[1 of 1 positions shown; findings below may reference images not displayed]

FINDINGS: Endotracheal tube tip is approximately 2.5 cm above the carina.
Right-sided Port-A-Cath and central line positioning are stable.
Lungs show similar edema pattern. Aeration at both lung bases is
slightly improved. This may be the result of less prominent pleural
effusions bilaterally. There is stable cardiomegaly.
IMPRESSION: Stable edema. Improved aeration at the lung bases with potentially
decrease in size of bilateral pleural effusions.

## 2015-03-04 IMAGING — CR DG CHEST 1V PORT
1 series · 1 of 1 positions shown · non-contrast
Comparison: DG CHEST 1V PORT dated 06/30/2013

CLINICAL DATA: 77-year-old female intubated. Initial encounter.

EXAM:
PORTABLE CHEST - 1 VIEW

[AP]
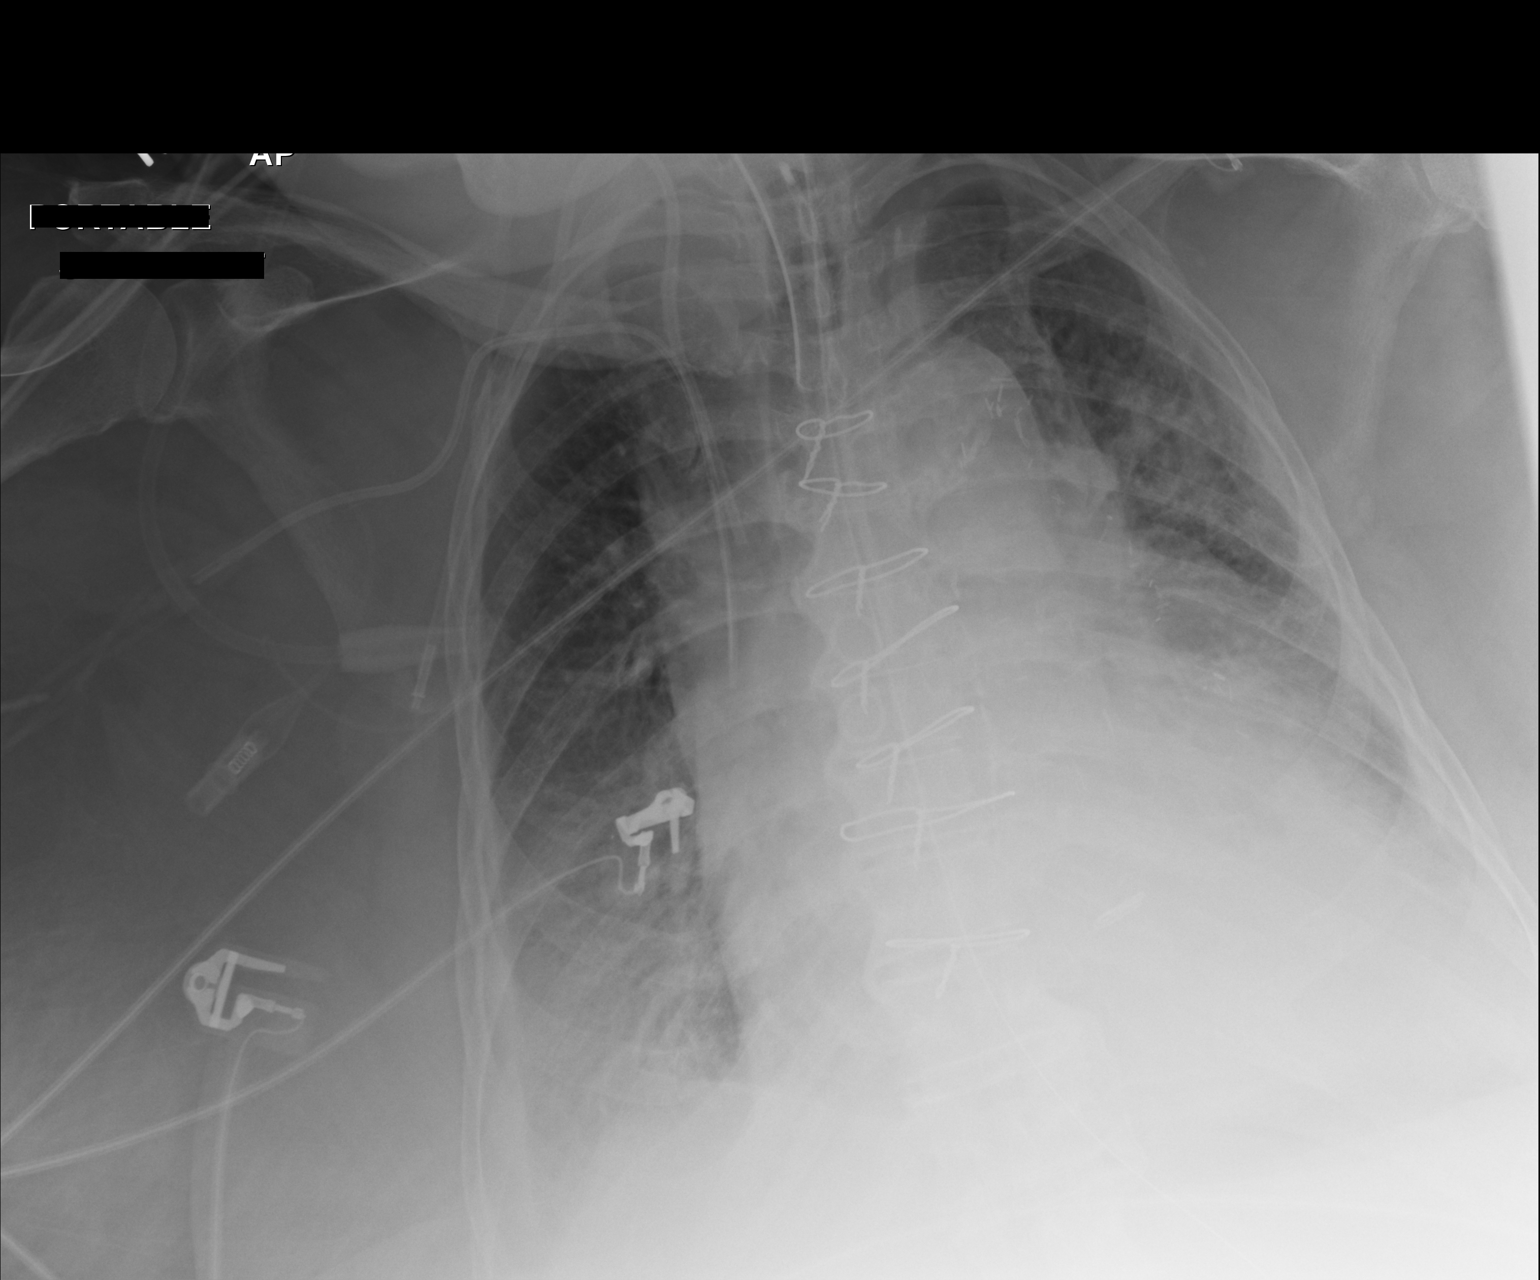

[1 of 1 positions shown; findings below may reference images not displayed]

FINDINGS: Portable AP semi upright view at I 8289 hrs. Endotracheal tube tip
just below the level the clavicles. Right chest porta cath re-
identified. Stable right subclavian approach central venous
catheter. Enteric tube courses to the left upper quadrant, tip not
included.

Stable cardiac size and mediastinal contours. Sequelae of CABG. Mild
bilateral veiling pulmonary opacity. Pulmonary vascularity appears
increased. No pneumothorax. Retrocardiac opacity mildly increased.
IMPRESSION: 1.  Stable lines and tubes.
2. Mildly increased interstitial opacity with small bilateral
pleural effusions, suggesting worsening interstitial edema.
3. Increased retrocardiac atelectasis or consolidation.

## 2015-03-08 IMAGING — CR DG ABDOMEN 1V
2 series · 2 of 2 positions shown · non-contrast
Comparison: 06/27/2013

CLINICAL DATA: Evaluate NG tube placement

EXAM:
ABDOMEN - 1 VIEW

[AP (1 of 2)]
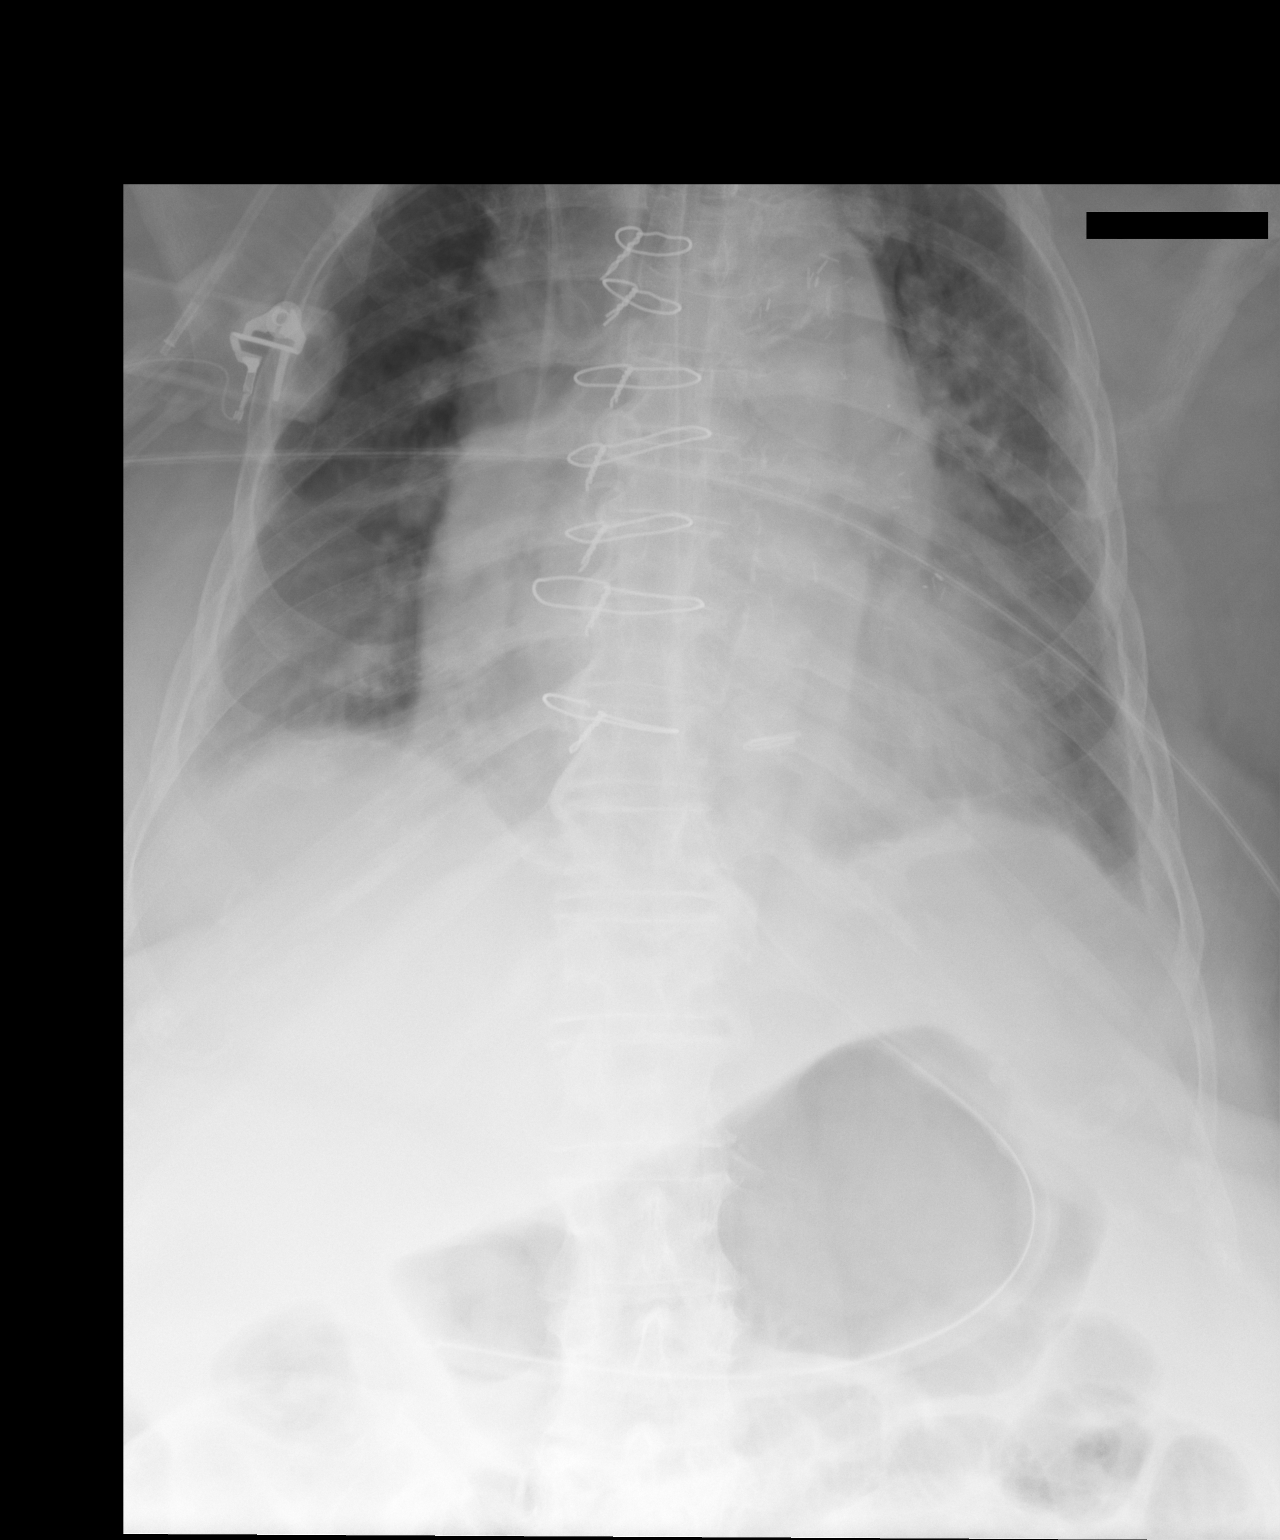

[AP (2 of 2)]
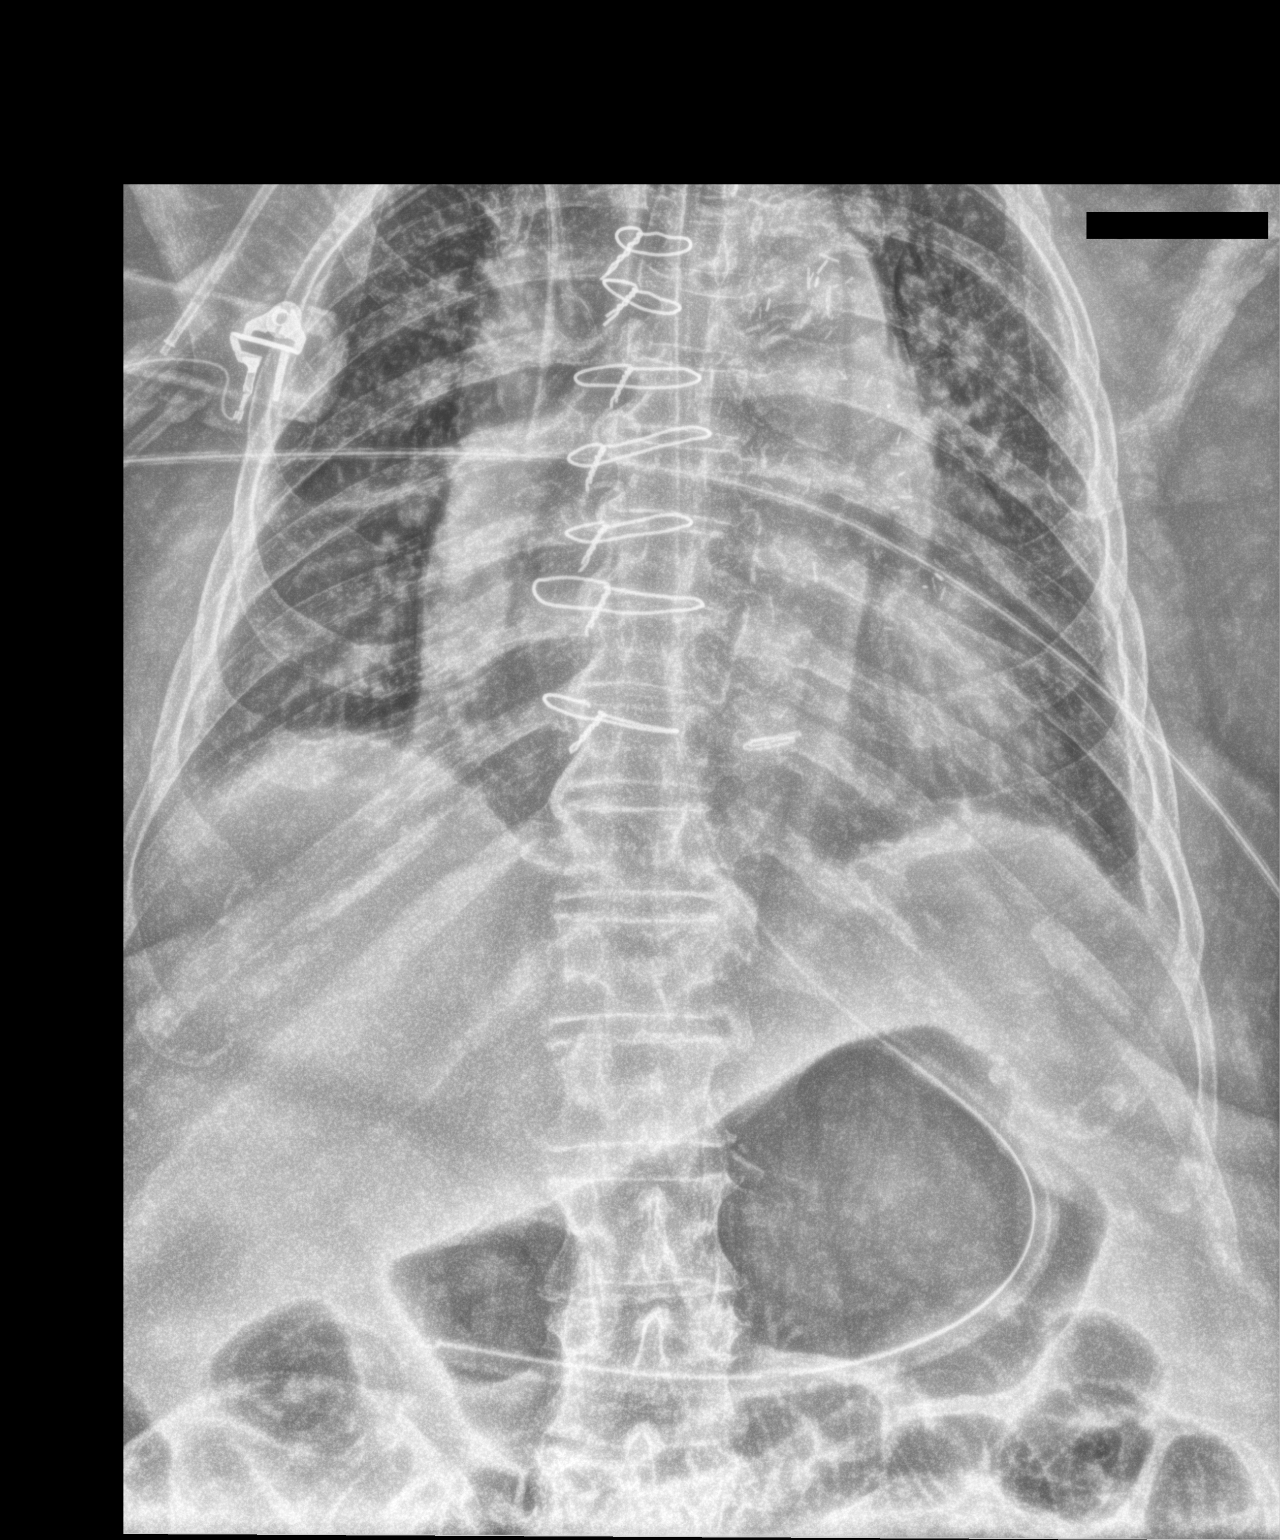

[2 of 2 positions shown; findings below may reference images not displayed]

FINDINGS: An NG tube has been placed and is seen with its tip projecting over
the antrum of the stomach. Cardiac enlargement and bilateral
parenchymal opacities in the lungs as described on prior studies not
evaluated in detail on this examination.
IMPRESSION: NG tube projects over the stomach with its tip in the region of the
antrum.

## 2015-12-02 ENCOUNTER — Inpatient Hospital Stay (HOSPITAL_COMMUNITY)
Admission: AD | Admit: 2015-12-02 | Discharge: 2015-12-10 | DRG: 438 | Disposition: A | Discharge status: Home Health | Payer: Medicare Other | Source: Other Acute Inpatient Hospital | Attending: Internal Medicine | Admitting: Internal Medicine

## 2015-12-02 ENCOUNTER — Telehealth: Payer: Self-pay | Admitting: Internal Medicine

## 2015-12-02 ENCOUNTER — Encounter (HOSPITAL_COMMUNITY): Payer: Self-pay | Admitting: Internal Medicine

## 2015-12-02 DIAGNOSIS — J449 Chronic obstructive pulmonary disease, unspecified: Secondary | ICD-10-CM | POA: Diagnosis present

## 2015-12-02 DIAGNOSIS — R109 Unspecified abdominal pain: Secondary | ICD-10-CM | POA: Diagnosis present

## 2015-12-02 DIAGNOSIS — N179 Acute kidney failure, unspecified: Secondary | ICD-10-CM

## 2015-12-02 DIAGNOSIS — E870 Hyperosmolality and hypernatremia: Secondary | ICD-10-CM | POA: Diagnosis present

## 2015-12-02 DIAGNOSIS — Z794 Long term (current) use of insulin: Secondary | ICD-10-CM | POA: Diagnosis not present

## 2015-12-02 DIAGNOSIS — Z6841 Body Mass Index (BMI) 40.0 and over, adult: Secondary | ICD-10-CM

## 2015-12-02 DIAGNOSIS — Z85038 Personal history of other malignant neoplasm of large intestine: Secondary | ICD-10-CM

## 2015-12-02 DIAGNOSIS — I25810 Atherosclerosis of coronary artery bypass graft(s) without angina pectoris: Secondary | ICD-10-CM | POA: Insufficient documentation

## 2015-12-02 DIAGNOSIS — N183 Chronic kidney disease, stage 3 (moderate): Secondary | ICD-10-CM | POA: Diagnosis present

## 2015-12-02 DIAGNOSIS — E1122 Type 2 diabetes mellitus with diabetic chronic kidney disease: Secondary | ICD-10-CM | POA: Diagnosis present

## 2015-12-02 DIAGNOSIS — Z9012 Acquired absence of left breast and nipple: Secondary | ICD-10-CM | POA: Diagnosis not present

## 2015-12-02 DIAGNOSIS — J9610 Chronic respiratory failure, unspecified whether with hypoxia or hypercapnia: Secondary | ICD-10-CM | POA: Diagnosis present

## 2015-12-02 DIAGNOSIS — Z01810 Encounter for preprocedural cardiovascular examination: Secondary | ICD-10-CM

## 2015-12-02 DIAGNOSIS — I2510 Atherosclerotic heart disease of native coronary artery without angina pectoris: Secondary | ICD-10-CM | POA: Diagnosis not present

## 2015-12-02 DIAGNOSIS — K8067 Calculus of gallbladder and bile duct with acute and chronic cholecystitis with obstruction: Secondary | ICD-10-CM | POA: Diagnosis present

## 2015-12-02 DIAGNOSIS — Z853 Personal history of malignant neoplasm of breast: Secondary | ICD-10-CM | POA: Diagnosis not present

## 2015-12-02 DIAGNOSIS — K851 Biliary acute pancreatitis without necrosis or infection: Secondary | ICD-10-CM | POA: Diagnosis present

## 2015-12-02 DIAGNOSIS — K219 Gastro-esophageal reflux disease without esophagitis: Secondary | ICD-10-CM | POA: Diagnosis present

## 2015-12-02 DIAGNOSIS — E1151 Type 2 diabetes mellitus with diabetic peripheral angiopathy without gangrene: Secondary | ICD-10-CM | POA: Diagnosis present

## 2015-12-02 DIAGNOSIS — R945 Abnormal results of liver function studies: Secondary | ICD-10-CM

## 2015-12-02 DIAGNOSIS — G4733 Obstructive sleep apnea (adult) (pediatric): Secondary | ICD-10-CM | POA: Diagnosis not present

## 2015-12-02 DIAGNOSIS — R7989 Other specified abnormal findings of blood chemistry: Secondary | ICD-10-CM | POA: Diagnosis not present

## 2015-12-02 DIAGNOSIS — K8020 Calculus of gallbladder without cholecystitis without obstruction: Secondary | ICD-10-CM

## 2015-12-02 DIAGNOSIS — K8043 Calculus of bile duct with acute cholecystitis with obstruction: Secondary | ICD-10-CM | POA: Diagnosis present

## 2015-12-02 DIAGNOSIS — E662 Morbid (severe) obesity with alveolar hypoventilation: Secondary | ICD-10-CM | POA: Diagnosis present

## 2015-12-02 DIAGNOSIS — D573 Sickle-cell trait: Secondary | ICD-10-CM | POA: Diagnosis present

## 2015-12-02 DIAGNOSIS — I509 Heart failure, unspecified: Secondary | ICD-10-CM | POA: Diagnosis present

## 2015-12-02 DIAGNOSIS — K8040 Calculus of bile duct with cholecystitis, unspecified, without obstruction: Secondary | ICD-10-CM

## 2015-12-02 DIAGNOSIS — I10 Essential (primary) hypertension: Secondary | ICD-10-CM | POA: Diagnosis present

## 2015-12-02 DIAGNOSIS — K8050 Calculus of bile duct without cholangitis or cholecystitis without obstruction: Secondary | ICD-10-CM

## 2015-12-02 DIAGNOSIS — I272 Other secondary pulmonary hypertension: Secondary | ICD-10-CM | POA: Diagnosis present

## 2015-12-02 DIAGNOSIS — Z9981 Dependence on supplemental oxygen: Secondary | ICD-10-CM

## 2015-12-02 DIAGNOSIS — I517 Cardiomegaly: Secondary | ICD-10-CM | POA: Diagnosis not present

## 2015-12-02 DIAGNOSIS — R4182 Altered mental status, unspecified: Secondary | ICD-10-CM | POA: Diagnosis present

## 2015-12-02 DIAGNOSIS — I130 Hypertensive heart and chronic kidney disease with heart failure and stage 1 through stage 4 chronic kidney disease, or unspecified chronic kidney disease: Secondary | ICD-10-CM | POA: Diagnosis present

## 2015-12-02 DIAGNOSIS — Z90710 Acquired absence of both cervix and uterus: Secondary | ICD-10-CM | POA: Diagnosis not present

## 2015-12-02 DIAGNOSIS — Z955 Presence of coronary angioplasty implant and graft: Secondary | ICD-10-CM

## 2015-12-02 DIAGNOSIS — E875 Hyperkalemia: Secondary | ICD-10-CM | POA: Diagnosis present

## 2015-12-02 DIAGNOSIS — F329 Major depressive disorder, single episode, unspecified: Secondary | ICD-10-CM | POA: Diagnosis present

## 2015-12-02 DIAGNOSIS — E119 Type 2 diabetes mellitus without complications: Secondary | ICD-10-CM

## 2015-12-02 DIAGNOSIS — K859 Acute pancreatitis without necrosis or infection, unspecified: Secondary | ICD-10-CM

## 2015-12-02 DIAGNOSIS — D72829 Elevated white blood cell count, unspecified: Secondary | ICD-10-CM | POA: Diagnosis present

## 2015-12-02 DIAGNOSIS — F419 Anxiety disorder, unspecified: Secondary | ICD-10-CM | POA: Diagnosis present

## 2015-12-02 DIAGNOSIS — K831 Obstruction of bile duct: Secondary | ICD-10-CM

## 2015-12-02 DIAGNOSIS — Z79899 Other long term (current) drug therapy: Secondary | ICD-10-CM

## 2015-12-02 HISTORY — DX: Unspecified osteoarthritis, unspecified site: M19.90

## 2015-12-02 HISTORY — DX: Unspecified asthma, uncomplicated: J45.909

## 2015-12-02 HISTORY — DX: Unspecified chronic bronchitis: J42

## 2015-12-02 HISTORY — DX: Adverse effect of unspecified anesthetic, initial encounter: T41.45XA

## 2015-12-02 HISTORY — DX: Chronic kidney disease, stage 3 (moderate): N18.3

## 2015-12-02 HISTORY — DX: Sickle-cell trait: D57.3

## 2015-12-02 HISTORY — DX: Anxiety disorder, unspecified: F41.9

## 2015-12-02 HISTORY — DX: Type 2 diabetes mellitus without complications: E11.9

## 2015-12-02 HISTORY — DX: Dependence on supplemental oxygen: Z99.81

## 2015-12-02 HISTORY — DX: Chronic kidney disease, stage 3 unspecified: N18.30

## 2015-12-02 HISTORY — DX: Other complications of anesthesia, initial encounter: T88.59XA

## 2015-12-02 HISTORY — DX: Malignant neoplasm of colon, unspecified: C18.9

## 2015-12-02 HISTORY — DX: Personal history of other medical treatment: Z92.89

## 2015-12-02 HISTORY — DX: Malignant neoplasm of unspecified site of left female breast: C50.912

## 2015-12-02 HISTORY — DX: Gastro-esophageal reflux disease without esophagitis: K21.9

## 2015-12-02 HISTORY — DX: Pure hypercholesterolemia, unspecified: E78.00

## 2015-12-02 HISTORY — DX: Major depressive disorder, single episode, unspecified: F32.9

## 2015-12-02 HISTORY — DX: Atherosclerotic heart disease of native coronary artery without angina pectoris: I25.10

## 2015-12-02 HISTORY — DX: Obstructive sleep apnea (adult) (pediatric): G47.33

## 2015-12-02 HISTORY — DX: Depression, unspecified: F32.A

## 2015-12-02 HISTORY — DX: Pneumonia, unspecified organism: J18.9

## 2015-12-02 LAB — URINALYSIS, ROUTINE W REFLEX MICROSCOPIC
Glucose, UA: NEGATIVE mg/dL
Hgb urine dipstick: NEGATIVE
KETONES UR: 15 mg/dL — AB
NITRITE: NEGATIVE
PH: 5 (ref 5.0–8.0)
PROTEIN: 30 mg/dL — AB
Specific Gravity, Urine: 1.026 (ref 1.005–1.030)

## 2015-12-02 LAB — COMPREHENSIVE METABOLIC PANEL
ALT: 55 U/L — ABNORMAL HIGH (ref 14–54)
AST: 117 U/L — ABNORMAL HIGH (ref 15–41)
Albumin: 2.6 g/dL — ABNORMAL LOW (ref 3.5–5.0)
Alkaline Phosphatase: 67 U/L (ref 38–126)
Anion gap: 13 (ref 5–15)
BUN: 21 mg/dL — ABNORMAL HIGH (ref 6–20)
CALCIUM: 8.7 mg/dL — AB (ref 8.9–10.3)
CHLORIDE: 104 mmol/L (ref 101–111)
CO2: 28 mmol/L (ref 22–32)
CREATININE: 2.63 mg/dL — AB (ref 0.44–1.00)
GFR calc non Af Amer: 16 mL/min — ABNORMAL LOW (ref >60)
GFR, EST AFRICAN AMERICAN: 19 mL/min — AB (ref >60)
GLUCOSE: 177 mg/dL — AB (ref 65–99)
Potassium: 3.6 mmol/L (ref 3.5–5.1)
Sodium: 145 mmol/L (ref 135–145)
Total Bilirubin: 3.9 mg/dL — ABNORMAL HIGH (ref 0.3–1.2)
Total Protein: 5.8 g/dL — ABNORMAL LOW (ref 6.5–8.1)

## 2015-12-02 LAB — GLUCOSE, CAPILLARY
GLUCOSE-CAPILLARY: 100 mg/dL — AB (ref 65–99)
GLUCOSE-CAPILLARY: 107 mg/dL — AB (ref 65–99)
GLUCOSE-CAPILLARY: 187 mg/dL — AB (ref 65–99)
Glucose-Capillary: 172 mg/dL — ABNORMAL HIGH (ref 65–99)
Glucose-Capillary: 148 mg/dL — ABNORMAL HIGH (ref 65–99)
Glucose-Capillary: 144 mg/dL — ABNORMAL HIGH (ref 65–99)

## 2015-12-02 LAB — CBC
HCT: 39.1 % (ref 36.0–46.0)
Hemoglobin: 12.5 g/dL (ref 12.0–15.0)
MCH: 28.7 pg (ref 26.0–34.0)
MCHC: 32 g/dL (ref 30.0–36.0)
MCV: 89.7 fL (ref 78.0–100.0)
Platelets: 203 10*3/uL (ref 150–400)
RBC: 4.36 MIL/uL (ref 3.87–5.11)
RDW: 15.2 % (ref 11.5–15.5)
WBC: 20.9 10*3/uL — ABNORMAL HIGH (ref 4.0–10.5)

## 2015-12-02 LAB — URINE MICROSCOPIC-ADD ON

## 2015-12-02 MED ORDER — PIPERACILLIN-TAZOBACTAM 3.375 G IVPB
3.3750 g | Freq: Three times a day (TID) | INTRAVENOUS | Status: DC
  Administered 2015-12-02 – 2015-12-10 (×24): 3.375 g via INTRAVENOUS
  Filled 2015-12-02 (×29): qty 50

## 2015-12-02 MED ORDER — MORPHINE SULFATE (PF) 2 MG/ML IV SOLN
2.0000 mg | INTRAVENOUS | Status: DC | PRN
Start: 2015-12-02 — End: 2015-12-10
  Administered 2015-12-02: 4 mg via INTRAVENOUS
  Administered 2015-12-08: 2 mg via INTRAVENOUS
  Filled 2015-12-02: qty 1
  Filled 2015-12-02 (×2): qty 2

## 2015-12-02 MED ORDER — BUDESONIDE 0.5 MG/2ML IN SUSP
0.5000 mg | Freq: Every day | RESPIRATORY_TRACT | Status: DC
  Administered 2015-12-02 – 2015-12-10 (×9): 0.5 mg via RESPIRATORY_TRACT
  Filled 2015-12-02 (×9): qty 2

## 2015-12-02 MED ORDER — MONTELUKAST SODIUM 10 MG PO TABS
10.0000 mg | ORAL_TABLET | Freq: Every day | ORAL | Status: DC
  Administered 2015-12-02 – 2015-12-09 (×8): 10 mg via ORAL
  Filled 2015-12-02 (×8): qty 1

## 2015-12-02 MED ORDER — PANTOPRAZOLE SODIUM 40 MG PO TBEC
40.0000 mg | DELAYED_RELEASE_TABLET | Freq: Every day | ORAL | Status: DC
  Administered 2015-12-02 – 2015-12-10 (×8): 40 mg via ORAL
  Filled 2015-12-02 (×9): qty 1

## 2015-12-02 MED ORDER — AMIODARONE HCL 200 MG PO TABS
200.0000 mg | ORAL_TABLET | Freq: Every day | ORAL | Status: DC
  Administered 2015-12-02 – 2015-12-10 (×8): 200 mg via ORAL
  Filled 2015-12-02 (×9): qty 1

## 2015-12-02 MED ORDER — SODIUM CHLORIDE 0.9 % IV BOLUS (SEPSIS)
1000.0000 mL | Freq: Once | INTRAVENOUS | Status: AC
  Administered 2015-12-02: 1000 mL via INTRAVENOUS

## 2015-12-02 MED ORDER — SODIUM CHLORIDE 0.9 % IV SOLN
3.0000 g | Freq: Three times a day (TID) | INTRAVENOUS | Status: DC
  Filled 2015-12-02 (×2): qty 3

## 2015-12-02 MED ORDER — LACTATED RINGERS IV SOLN
INTRAVENOUS | Status: DC
  Administered 2015-12-02 – 2015-12-04 (×7): via INTRAVENOUS

## 2015-12-02 MED ORDER — PAROXETINE HCL 20 MG PO TABS
40.0000 mg | ORAL_TABLET | Freq: Every day | ORAL | Status: DC
  Administered 2015-12-02 – 2015-12-10 (×8): 40 mg via ORAL
  Filled 2015-12-02 (×10): qty 2

## 2015-12-02 MED ORDER — SODIUM CHLORIDE 0.9 % IV BOLUS (SEPSIS)
500.0000 mL | Freq: Once | INTRAVENOUS | Status: AC
  Administered 2015-12-02: 500 mL via INTRAVENOUS

## 2015-12-02 MED ORDER — SODIUM CHLORIDE 0.9 % IV SOLN
INTRAVENOUS | Status: DC
  Administered 2015-12-02 (×2): via INTRAVENOUS

## 2015-12-02 MED ORDER — INSULIN ASPART 100 UNIT/ML ~~LOC~~ SOLN
0.0000 [IU] | SUBCUTANEOUS | Status: DC
  Administered 2015-12-02: 2 [IU] via SUBCUTANEOUS
  Administered 2015-12-02: 3 [IU] via SUBCUTANEOUS
  Administered 2015-12-02: 2 [IU] via SUBCUTANEOUS
  Administered 2015-12-02: 3 [IU] via SUBCUTANEOUS
  Administered 2015-12-03 – 2015-12-05 (×6): 2 [IU] via SUBCUTANEOUS
  Administered 2015-12-06 (×2): 3 [IU] via SUBCUTANEOUS
  Administered 2015-12-06: 2 [IU] via SUBCUTANEOUS
  Administered 2015-12-07 (×3): 3 [IU] via SUBCUTANEOUS
  Administered 2015-12-07 – 2015-12-08 (×7): 2 [IU] via SUBCUTANEOUS

## 2015-12-02 MED ORDER — PIPERACILLIN-TAZOBACTAM 3.375 G IVPB 30 MIN
3.3750 g | INTRAVENOUS | Status: AC
  Administered 2015-12-02: 3.375 g via INTRAVENOUS
  Filled 2015-12-02: qty 50

## 2015-12-02 MED ORDER — ONDANSETRON HCL 4 MG/2ML IJ SOLN
4.0000 mg | Freq: Four times a day (QID) | INTRAMUSCULAR | Status: DC | PRN
  Administered 2015-12-07: 4 mg via INTRAVENOUS
  Filled 2015-12-02: qty 2

## 2015-12-02 NOTE — Telephone Encounter (Signed)
80 yo F with 3 days of abd pain RUQ.  Found to have gallstone pancreatitis, cholecystitis.  WBC 20k, AKI with creatinine of 2.1, normal vital signs, CT shows mild pancreatitis, does have cholecystitis and cholelithiasis.  Bili 2.1, LFTs actually not that elevated, lipase > 30,000.  US abdomen shows prominance of bile duct and rec MRCP / ERCP.  Getting transferred from [facility] because their GI dosent do ERCP there apparently.  I had them start patient on unasyn.  Going to med surg bed for now.

## 2015-12-02 NOTE — Consult Note (Signed)
[facility] Consult: 1:06 PM 12/02/2015  LOS: 0 days    Referring Provider: Dr Charlies Silvers  Primary Care Physician:  No PCP Per Patient Primary Gastroenterologist:  Althia Forts pt from [facility]     Reason for Consultation:  Choledocholithiasis??   HPI: Lori Burnett is a morbidly obese, diabetic 80 y.o. female.  Hx breast and colon cancer, no detail regarding these.  COPD, OSA, previous trach 04/2014.  PAD, CAD.  S/p CABG 05/2013.    06/2013 EGD while at [facility], Dr Olevia Perches for CG emesis.  Antral gastropathy.  Solitary, non-bleeding bulbar ulcer.  Path: active gastritis, no H Pylori.  Home meds include po iron, Prilosec, 81 mg ASA but no plavix, Coumadin etc.   Acute abdominal pain with N/V.  Radiation to flanks and back.  Went to [facility].  Lipase >30K.   Ct with mild acute pancreatitis.  US gallbladder showed gall stones, distended and thick GB wall, pericholecystic fluid, extrahepatic ductal prominence suspicious for CBD stone. As she needed MRCP and ERCP she was transferred to [facility].    Today labs with WBC 20 K.  No lipase.  LFTs elevated, see below.    Pt is a poor historian.  She says the pain has been there for several days.  No N/V til she got to [place].  No previous abd pain.  Pain is all over her belly.     Past Medical History  Diagnosis Date  . COPD (chronic obstructive pulmonary disease) ([place])   . Coronary artery disease   . Hypertension   . CHF (congestive heart failure) ([place])   . Diabetes mellitus without complication ([place])   . Sleep apnea   . Pulmonary hypertension ([place])   . PAD (peripheral artery disease) ([place])   . History of colon cancer   . HX: breast cancer     Past Surgical History  Procedure Laterality Date  . Tracheostomy tube placement N/A 07/03/2013    Procedure: TRACHEOSTOMY;  Surgeon:  Rozetta Nunnery, MD;  Location: [facility] OR;  Service: ENT;  Laterality: N/A;  . Coronary artery bypass graft    . Mastectomy    . Colon surgery    . Esophagogastroduodenoscopy N/A 07/07/2013    Procedure: ESOPHAGOGASTRODUODENOSCOPY (EGD);  Surgeon: Lafayette Dragon, MD;  Location: [facility] ENDOSCOPY;  Service: Endoscopy;  Laterality: N/A;  . Abdominal hysterectomy      Prior to Admission medications   Medication Sig Start Date End Date Taking? Authorizing Provider  amiodarone (PACERONE) 200 MG tablet Take 200 mg by mouth daily.   Yes Historical Provider, MD  aspirin 81 MG tablet Take 81 mg by mouth daily.   Yes Historical Provider, MD  atorvastatin (LIPITOR) 20 MG tablet Take 20 mg by mouth daily.   Yes Historical Provider, MD  budesonide (PULMICORT) 0.5 MG/2ML nebulizer solution Take 0.5 mg by nebulization 2 (two) times daily.   Yes Historical Provider, MD  Choline Fenofibrate 135 MG capsule Take 135 mg by mouth daily.   Yes Historical Provider, MD  docusate sodium (COLACE) 50 MG capsule Take 50 mg by  mouth 2 (two) times daily as needed for mild constipation.   Yes Historical Provider, MD  ferrous sulfate 325 (65 FE) MG tablet Take 325 mg by mouth daily with breakfast.   Yes Historical Provider, MD  fluticasone (FLONASE) 50 MCG/ACT nasal spray Place into both nostrils daily.   Yes Historical Provider, MD  folic acid (FOLVITE) 1 MG tablet Take 1 mg by mouth daily.   Yes Historical Provider, MD  furosemide (LASIX) 40 MG tablet Take 40 mg by mouth daily.   Yes Historical Provider, MD  insulin aspart (NOVOLOG) 100 UNIT/ML injection Inject 0-3 Units into the skin See admin instructions. Patient uses sliding scale   Yes Historical Provider, MD  insulin glargine (LANTUS) 100 UNIT/ML injection Inject 70 Units into the skin at bedtime.    Yes Historical Provider, MD  lisinopril (PRINIVIL,ZESTRIL) 5 MG tablet Take 5 mg by mouth daily.   Yes Historical Provider, MD  loratadine (CLARITIN) 10 MG tablet Take 10 mg  by mouth daily.   Yes Historical Provider, MD  montelukast (SINGULAIR) 10 MG tablet Take 10 mg by mouth at bedtime.   Yes Historical Provider, MD  Multiple Vitamins-Minerals (MULTIVITAMIN WITH MINERALS) tablet Take 1 tablet by mouth daily.   Yes Historical Provider, MD  omeprazole (PRILOSEC) 20 MG capsule Take 20 mg by mouth daily.   Yes Historical Provider, MD  pantoprazole (PROTONIX) 40 MG tablet Take 40 mg by mouth daily.   Yes Historical Provider, MD  PARoxetine (PAXIL) 40 MG tablet Take 40 mg by mouth every morning.   Yes Historical Provider, MD  montelukast (SINGULAIR) 10 MG tablet Take 10 mg by mouth at bedtime.    Historical Provider, MD  nitroGLYCERIN (NITROSTAT) 0.4 MG SL tablet Place 0.4 mg under the tongue every 5 (five) minutes as needed for chest pain.    Historical Provider, MD  omeprazole (PRILOSEC) 20 MG capsule Take 20 mg by mouth daily.    Historical Provider, MD  potassium chloride SA (K-DUR,KLOR-CON) 20 MEQ tablet Take 20 mEq by mouth daily.    Historical Provider, MD    Scheduled Meds: . amiodarone  200 mg Oral Daily  . budesonide (PULMICORT) nebulizer solution  0.5 mg Nebulization Daily  . insulin aspart  0-15 Units Subcutaneous Q4H  . montelukast  10 mg Oral QHS  . pantoprazole  40 mg Oral Daily  . PARoxetine  40 mg Oral Daily  . piperacillin-tazobactam (ZOSYN)  IV  3.375 g Intravenous Q8H   Infusions: . sodium chloride 125 mL/hr at 12/02/15 0529   PRN Meds: morphine injection, ondansetron (ZOFRAN) IV   Allergies as of 12/02/2015  . (No Known Allergies)    No family history on file.  Social History   Social History  . Marital Status: Divorced    Spouse Name: N/A  . Number of Children: N/A  . Years of Education: N/A   Occupational History  . Not on file.   Social History Main Topics  . Smoking status: Never Smoker   . Smokeless tobacco: Not on file  . Alcohol Use: Not on file  . Drug Use: Not on file  . Sexual Activity: Not on file   Other  Topics Concern  . Not on file   Social History Narrative    REVIEW OF SYSTEMS: Constitutional:  Generally sedentary.  ENT:  No nose bleeds Pulm:  No trouble breathing, uses oxygen prn at home CV:  No palpitations, no LE edema.  GU:  No hematuria, no frequency GI:  No dysphagia.  Last BM was yesterday.. No bloody or black stool Heme:  No unusual bleeding or bruising.     Transfusions:  No recall, but may have been transfused in 2015.   Neuro:  No headaches, no peripheral tingling or numbness Derm:  No itching, no rash or sores.  Endocrine:  No sweats or chills.  No polyuria or dysuria Immunization:  Not queried,  Travel:  None beyond local counties in last few months.    PHYSICAL EXAM: Vital signs in last 24 hours: Filed Vitals:   12/02/15 0452  BP: 97/45  Pulse: 88  Temp: 98.2 F (36.8 C)  Resp: 19   Wt Readings from Last 3 Encounters:  12/02/15 109.7 kg (241 lb 13.5 oz)    General: obese, somnolent/nearly obtunded AAF.  comfortable Head:  No asymmetry.  + cushingoid/obese  Eyes:  No icterus or pallor Ears:  ? HOH vs obtunded Nose:  No congestion or discharge Mouth:  Clear, moist Neck:  No mass, no TMG.  Trach scar.  Lungs:  No dyspnea or cough.  Decreased but clear BS Breast:  Left breast implant. Heart: RRR.  No mrg.  S1/S2 present Abdomen:  Obese, soft, tnder without guard or rebound throughout.  hypoactiveBS.  Long midline scar.   Rectal: deferred   Musc/Skeltl: no joint swelling or contractures Extremities:  No CCE  Neurologic:  Oriented to self, not to year.  Oriented to "hospital" but not to town.  Moves all 4 limbs with vigorous prompting.  No tremor.  Skin:  No rash or sores Psych:  cooperative  Intake/Output from previous day:   Intake/Output this shift:    LAB RESULTS:  Recent Labs  12/02/15 0923  WBC 20.9*  HGB 12.5  HCT 39.1  PLT 203   BMET Lab Results  Component Value Date   NA 145 12/02/2015   NA 140 08/06/2013   NA 154*  08/05/2013   K 3.6 12/02/2015   K 4.3 08/06/2013   K 4.4 08/05/2013   CL 104 12/02/2015   CL 100 08/06/2013   CL 107 08/05/2013   CO2 28 12/02/2015   CO2 36* 08/06/2013   CO2 37* 08/05/2013   GLUCOSE 177* 12/02/2015   GLUCOSE 164* 08/06/2013   GLUCOSE 138* 08/05/2013   BUN 21* 12/02/2015   BUN 38* 08/06/2013   BUN 34* 08/05/2013   CREATININE 2.63* 12/02/2015   CREATININE 1.18* 08/06/2013   CREATININE 1.18* 08/05/2013   CALCIUM 8.7* 12/02/2015   CALCIUM 9.8 08/06/2013   CALCIUM 10.2 08/05/2013   LFT  Recent Labs  12/02/15 0923  PROT 5.8*  ALBUMIN 2.6*  AST 117*  ALT 55*  ALKPHOS 67  BILITOT 3.9*   PT/INR Lab Results  Component Value Date   INR 1.15 07/06/2013   INR 1.21 07/02/2013   Hepatitis Panel No results for input(s): HEPBSAG, HCVAB, HEPAIGM, HEPBIGM in the last 72 hours. C-Diff No components found for: CDIFF Lipase     Component Value Date/Time   LIPASE 31 07/06/2013 1227    Drugs of Abuse  No results found for: LABOPIA, COCAINSCRNUR, LABBENZ, AMPHETMU, THCU, LABBARB   RADIOLOGY STUDIES: No results found.  ENDOSCOPIC STUDIES: Per HPI  IMPRESSION:   *  Acute pancreatitis, biliary.    *  Acute cholecystitis. On Zosyn.   *  Rule out choledocholithiasis.    *  OSA, COPD.  Previous trach in 2015.    *  Altered mental status, not clear if this is her baseline.  Seems to be pickwickian.    *  Hx breast and colon cancer   PLAN:     *  Cancelled the ultrasound and ordered MRCP.    *  Lipase and CMET, CBC in AM.     Azucena Freed  12/02/2015, 1:06 PM Pager: [phone]

## 2015-12-02 NOTE — Progress Notes (Signed)
Also, [facility] phone line is apparently not working according to [place], but I believe that they do have someone on able to do ERCP tomorrow (if needed) based on earlier conversation with Dr. Silverio Decamp tonight on another patient.

## 2015-12-02 NOTE — Care Management Important Message (Signed)
Important Message  Patient Details  Name: Lori Burnett MRN: [id number] Date of Birth: Mar 15, 1936   Medicare Important Message Given:  Yes    Loann Quill 12/02/2015, 10:02 AM

## 2015-12-02 NOTE — H&P (Addendum)
History and Physical    Lori Burnett [id number] DOB: [birth year] DOA: 12/02/2015   PCP: No PCP Per Patient Chief Complaint: No chief complaint on file.   HPI: Lori Burnett is a 80 y.o. female with medical history significant of DM, HTN, HVOS, CAD.  Patient presents to the ED at [facility] with c/o N/V, abdominal pain radiating to both flanks and back.  Symptoms onset yesterday morning.  Have worsened earlier in the evening which prompted her to go to [facility].  ED Course: Work up in the ED at [facility] demonstrated acute pancreatitis with lipase > 30k, CT scan showed "mild acute pancreatitis", US gallbladder showed distended bile duct, cholecystitis, cholelithiasis, and clinical picture that was suspicious for choledocholithiasis.  She also has AKI with creatinine of 2.1, leukocytosis with WBC of 20k.  Patient given 1L bolus IVF and Zosyn.  Due to patient likely needing MRCP / ERCP, she was transferred to [facility].  Review of Systems: As per HPI otherwise 10 point review of systems negative.    Past Medical History  Diagnosis Date  . COPD (chronic obstructive pulmonary disease) ([place])   . Coronary artery disease   . Hypertension   . CHF (congestive heart failure) ([place])   . Diabetes mellitus without complication ([place])   . Sleep apnea   . Pulmonary hypertension ([place])   . PAD (peripheral artery disease) ([place])   . History of colon cancer   . HX: breast cancer     Past Surgical History  Procedure Laterality Date  . Tracheostomy tube placement N/A 07/03/2013    Procedure: TRACHEOSTOMY;  Surgeon: Rozetta Nunnery, MD;  Location: [facility] OR;  Service: ENT;  Laterality: N/A;  . Coronary artery bypass graft    . Mastectomy    . Colon surgery    . Esophagogastroduodenoscopy N/A 07/07/2013    Procedure: ESOPHAGOGASTRODUODENOSCOPY (EGD);  Surgeon: Lafayette Dragon, MD;  Location: [facility] ENDOSCOPY;  Service: Endoscopy;  Laterality: N/A;  . Abdominal hysterectomy       reports that she has never smoked. She does  not have any smokeless tobacco history on file. Her alcohol and drug histories are not on file.  No Known Allergies  No family history on file.   Prior to Admission medications   Medication Sig Start Date End Date Taking? Authorizing Provider  atenolol (TENORMIN) 100 MG tablet Take 100 mg by mouth daily.    Historical Provider, MD  atorvastatin (LIPITOR) 20 MG tablet Take 20 mg by mouth daily.    Historical Provider, MD  Choline Fenofibrate 135 MG capsule Take 135 mg by mouth daily.    Historical Provider, MD  cloNIDine (CATAPRES) 0.1 MG tablet Take 0.1 mg by mouth.    Historical Provider, MD  fluticasone (FLONASE) 50 MCG/ACT nasal spray Place into both nostrils daily.    Historical Provider, MD  insulin aspart (NOVOLOG) 100 UNIT/ML injection Inject into the skin 3 (three) times daily before meals.    Historical Provider, MD  insulin glargine (LANTUS) 100 UNIT/ML injection Inject into the skin at bedtime.    Historical Provider, MD  montelukast (SINGULAIR) 10 MG tablet Take 10 mg by mouth at bedtime.    Historical Provider, MD  nitroGLYCERIN (NITROSTAT) 0.4 MG SL tablet Place 0.4 mg under the tongue every 5 (five) minutes as needed for chest pain.    Historical Provider, MD  omeprazole (PRILOSEC) 20 MG capsule Take 20 mg by mouth daily.    Historical Provider, MD  PARoxetine (PAXIL) 40 MG tablet Take 40  mg by mouth every morning.    Historical Provider, MD  potassium chloride SA (K-DUR,KLOR-CON) 20 MEQ tablet Take 20 mEq by mouth daily.    Historical Provider, MD    Physical Exam: Filed Vitals:   12/02/15 0452  BP: 97/45  Pulse: 88  Temp: 98.2 F (36.8 C)  TempSrc: Oral  Resp: 19  Height: 5\' 3"  (1.6 m)  Weight: 109.7 kg (241 lb 13.5 oz)  SpO2: 91%      Constitutional: NAD, calm, comfortable Eyes: PERRL, lids and conjunctivae normal ENMT: Mucous membranes are moist. Posterior pharynx clear of any exudate or lesions.Normal dentition.  Neck: normal, supple, no masses, no  thyromegaly Respiratory: clear to auscultation bilaterally, no wheezing, no crackles. Normal respiratory effort. No accessory muscle use.  Cardiovascular: Regular rate and rhythm, no murmurs / rubs / gallops. No extremity edema. 2+ pedal pulses. No carotid bruits.  Abdomen: no tenderness, no masses palpated. No hepatosplenomegaly. Bowel sounds positive.  Musculoskeletal: no clubbing / cyanosis. No joint deformity upper and lower extremities. Good ROM, no contractures. Normal muscle tone.  Skin: no rashes, lesions, ulcers. No induration Neurologic: CN 2-12 grossly intact. Sensation intact, DTR normal. Strength 5/5 in all 4.  Psychiatric: Normal judgment and insight. Alert and oriented x 3. Normal mood.    Labs on Admission: I have personally reviewed following labs and imaging studies  CBC: No results for input(s): WBC, NEUTROABS, HGB, HCT, MCV, PLT in the last 168 hours. Basic Metabolic Panel: No results for input(s): NA, K, CL, CO2, GLUCOSE, BUN, CREATININE, CALCIUM, MG, PHOS in the last 168 hours. GFR: CrCl cannot be calculated (Patient has no serum creatinine result on file.). Liver Function Tests: No results for input(s): AST, ALT, ALKPHOS, BILITOT, PROT, ALBUMIN in the last 168 hours. No results for input(s): LIPASE, AMYLASE in the last 168 hours. No results for input(s): AMMONIA in the last 168 hours. Coagulation Profile: No results for input(s): INR, PROTIME in the last 168 hours. Cardiac Enzymes: No results for input(s): CKTOTAL, CKMB, CKMBINDEX, TROPONINI in the last 168 hours. BNP (last 3 results) No results for input(s): PROBNP in the last 8760 hours. HbA1C: No results for input(s): HGBA1C in the last 72 hours. CBG: No results for input(s): GLUCAP in the last 168 hours. Lipid Profile: No results for input(s): CHOL, HDL, LDLCALC, TRIG, CHOLHDL, LDLDIRECT in the last 72 hours. Thyroid Function Tests: No results for input(s): TSH, T4TOTAL, FREET4, T3FREE, THYROIDAB in the  last 72 hours. Anemia Panel: No results for input(s): VITAMINB12, FOLATE, FERRITIN, TIBC, IRON, RETICCTPCT in the last 72 hours. Urine analysis:    Component Value Date/Time   COLORURINE YELLOW 06/27/2013 0714   APPEARANCEUR CLOUDY* 06/27/2013 0714   LABSPEC 1.013 06/27/2013 0714   PHURINE 5.0 06/27/2013 0714   GLUCOSEU NEGATIVE 06/27/2013 0714   HGBUR SMALL* 06/27/2013 0714   BILIRUBINUR NEGATIVE 06/27/2013 0714   KETONESUR NEGATIVE 06/27/2013 0714   PROTEINUR NEGATIVE 06/27/2013 0714   UROBILINOGEN 0.2 06/27/2013 0714   NITRITE NEGATIVE 06/27/2013 0714   LEUKOCYTESUR SMALL* 06/27/2013 0714   Sepsis Labs: @LABRCNTIP (procalcitonin:4,lacticidven:4) )No results found for this or any previous visit (from the past 240 hour(s)).   Radiological Exams on Admission: No results found.  EKG: Independently reviewed.  Assessment/Plan Principal Problem:   Choledocholithiasis with acute cholecystitis with obstruction Active Problems:   OSA (obstructive sleep apnea)   Morbid obesity (HCC)   DM2 (diabetes mellitus, type 2) (HCC)   HTN (hypertension)   Leukocytosis   AKI (acute kidney injury) ([place])  Acute gallstone pancreatitis   Choledocholithiasis, with acute cholecystitis, obstruction, and acute gallstone pancreatitis -  Zosyn  Repeating CMP and CBC now  IVF: got 1L in ED, giving 2nd L bolus now and then 125 cc/hr  Needs GI consult later this morning, then either MRCP or proceed directly to ERCP depending on their opinion.  Morphine PRN pain  Zofran PRN nausea   AKI - likely pre-renal in setting of pancreatitis and borderline low BPs.  Hydrating, rechecking CMP  Strict intake and output  Hold diuretics and ACEi  Not entirely clear on baseline but it sounds like baseline creatinine may be 1.9 per family  HTN - holding BP meds as she has borderline low BPs at present time, running 90s to low [id number] systolic.  DM2 -  Patient and family are unsure of her home insulin  dosing  Will put her on SSI Q4H moderate scale for now as she is NPO, may require upping to resistant scale as she does take 70 units of lantus daily.  Morbid obesity - BMI 71  Was very difficult to wean from vent post op from CABG in 2015, required trach and spending what looks like a month in the LTAC here weaning according to PCCM notes  OSA - BIPAP per home settings at bed time  On amiodarone - this apparently got started following the CABG in 2015, and patient has been on this medication ever since.  Family and patient are not entirely clear as to why she is on this medication.  ? H/o A.Fib?  Will continue this long term med for the moment.   DVT prophylaxis: SCDs only due to probable ERCP Code Status: Full Family Communication: Family in room Consults called: None Admission status: Admit to inpatient   Etta Quill DO [facility] Pager [phone] from 7PM-7AM  If 7AM-7PM, please contact the day physician for the patient [email or web address] Password [facility]  12/02/2015, 5:28 AM

## 2015-12-02 NOTE — Progress Notes (Signed)
Pt has only voided 150cc today.  Bladder scan showed 85cc in bladder about 15-20 post void.  MD notified and awaiting orders or return call.  Eliezer Bottom [place]

## 2015-12-02 NOTE — Progress Notes (Signed)
[facility]o Harvie Junior no. [phone] said pt has gram positive cocci anaerobic in one tube, also pt for MRCP and the tech called me she might do it 12 midnight or early in the morning, on call MD Baltazar Najjar informed.

## 2015-12-02 NOTE — Progress Notes (Signed)
Pharmacy Antibiotic Note  Lori Burnett is a 80 y.o. female admitted on 12/02/2015 with intra-abd infection. Transferred from [facility] for ERCP. Pharmacy has been consulted for Zosyn dosing.  Plan: Zosyn 3.375gm IV now over 30 min then 3.375gm IV q8h - subsequent doses over 4 hours Will f/u micro data, renal function, and pt's clinical condition   Height: 5\' 3"  (160 cm) Weight: 241 lb 13.5 oz (109.7 kg) IBW/kg (Calculated) : 52.4  Temp (24hrs), Avg:98.2 F (36.8 C), Min:98.2 F (36.8 C), Max:98.2 F (36.8 C)  No results for input(s): WBC, CREATININE, LATICACIDVEN, VANCOTROUGH, VANCOPEAK, VANCORANDOM, GENTTROUGH, GENTPEAK, GENTRANDOM, TOBRATROUGH, TOBRAPEAK, TOBRARND, AMIKACINPEAK, AMIKACINTROU, AMIKACIN in the last 168 hours.  CrCl cannot be calculated (Patient has no serum creatinine result on file.).    No Known Allergies  Antimicrobials this admission: 6/15 Zosyn >>   Thank you for allowing pharmacy to be a part of this patient's care.  Sherlon Handing, PharmD, BCPS Clinical pharmacist, pager [phone] 12/02/2015 5:45 AM

## 2015-12-02 NOTE — Progress Notes (Signed)
PT admitted after midnight. For details please refer to admission note done 12/02/2015  80 y.o. female with medical history significant for DM, HTN, CAD. Patient presented [facility] with N/V, abdominal pain. Work up in the ED at [facility] demonstrated acute pancreatitis with lipase > 30k, CT scan showed "mild acute pancreatitis", US gallbladder showed distended bile duct, cholecystitis, cholelithiasis and clinical picture that was suspicious for choledocholithiasis. She was transferred to [facility] for further management. I spoke with [facility], they will see the pt in consultation. Continue NPO, IV fluids Continue IV zosyn   Leisa Lenz  [facility] [phone]

## 2015-12-03 ENCOUNTER — Inpatient Hospital Stay (HOSPITAL_COMMUNITY): Payer: Medicare Other | Admitting: Anesthesiology

## 2015-12-03 ENCOUNTER — Encounter (HOSPITAL_COMMUNITY): Payer: Self-pay | Admitting: General Surgery

## 2015-12-03 ENCOUNTER — Encounter (HOSPITAL_COMMUNITY)
Admission: AD | Disposition: A | Discharge status: Home Health | Payer: Self-pay | Source: Other Acute Inpatient Hospital | Attending: Internal Medicine

## 2015-12-03 ENCOUNTER — Inpatient Hospital Stay (HOSPITAL_COMMUNITY): Payer: Medicare Other

## 2015-12-03 HISTORY — PX: ERCP: SHX5425

## 2015-12-03 LAB — GLUCOSE, CAPILLARY
GLUCOSE-CAPILLARY: 119 mg/dL — AB (ref 65–99)
Glucose-Capillary: 112 mg/dL — ABNORMAL HIGH (ref 65–99)
Glucose-Capillary: 117 mg/dL — ABNORMAL HIGH (ref 65–99)
Glucose-Capillary: 121 mg/dL — ABNORMAL HIGH (ref 65–99)
Glucose-Capillary: 121 mg/dL — ABNORMAL HIGH (ref 65–99)
Glucose-Capillary: 126 mg/dL — ABNORMAL HIGH (ref 65–99)

## 2015-12-03 LAB — LIPASE, BLOOD: LIPASE: 277 U/L — AB (ref 11–51)

## 2015-12-03 LAB — CBC
HCT: 37.5 % (ref 36.0–46.0)
Hemoglobin: 12 g/dL (ref 12.0–15.0)
MCH: 28.5 pg (ref 26.0–34.0)
MCHC: 32 g/dL (ref 30.0–36.0)
MCV: 89.1 fL (ref 78.0–100.0)
PLATELETS: 182 10*3/uL (ref 150–400)
RBC: 4.21 MIL/uL (ref 3.87–5.11)
RDW: 15.6 % — AB (ref 11.5–15.5)
WBC: 12.9 10*3/uL — AB (ref 4.0–10.5)

## 2015-12-03 LAB — COMPREHENSIVE METABOLIC PANEL
ALBUMIN: 2.4 g/dL — AB (ref 3.5–5.0)
ALT: 70 U/L — ABNORMAL HIGH (ref 14–54)
ANION GAP: 9 (ref 5–15)
AST: 112 U/L — ABNORMAL HIGH (ref 15–41)
Alkaline Phosphatase: 98 U/L (ref 38–126)
BILIRUBIN TOTAL: 3.5 mg/dL — AB (ref 0.3–1.2)
BUN: 24 mg/dL — ABNORMAL HIGH (ref 6–20)
CHLORIDE: 110 mmol/L (ref 101–111)
CO2: 25 mmol/L (ref 22–32)
Calcium: 8.4 mg/dL — ABNORMAL LOW (ref 8.9–10.3)
Creatinine, Ser: 2.47 mg/dL — ABNORMAL HIGH (ref 0.44–1.00)
GFR calc Af Amer: 21 mL/min — ABNORMAL LOW (ref >60)
GFR calc non Af Amer: 18 mL/min — ABNORMAL LOW (ref >60)
GLUCOSE: 104 mg/dL — AB (ref 65–99)
POTASSIUM: 3.5 mmol/L (ref 3.5–5.1)
SODIUM: 144 mmol/L (ref 135–145)
TOTAL PROTEIN: 5.7 g/dL — AB (ref 6.5–8.1)

## 2015-12-03 SURGERY — ERCP, WITH INTERVENTION IF INDICATED
Anesthesia: General

## 2015-12-03 MED ORDER — LACTATED RINGERS IV SOLN: INTRAVENOUS | Status: DC | PRN

## 2015-12-03 MED ORDER — ONDANSETRON HCL 4 MG/2ML IJ SOLN
INTRAMUSCULAR | Status: DC | PRN
  Administered 2015-12-03: 4 mg via INTRAVENOUS

## 2015-12-03 MED ORDER — PHENYLEPHRINE HCL 10 MG/ML IJ SOLN
INTRAMUSCULAR | Status: DC | PRN
  Administered 2015-12-03 (×2): 80 ug via INTRAVENOUS

## 2015-12-03 MED ORDER — SUGAMMADEX SODIUM 200 MG/2ML IV SOLN
INTRAVENOUS | Status: DC | PRN
  Administered 2015-12-03: 200 mg via INTRAVENOUS

## 2015-12-03 MED ORDER — INDOMETHACIN 50 MG RE SUPP
RECTAL | Status: DC | PRN
  Administered 2015-12-03: 100 mg via RECTAL

## 2015-12-03 MED ORDER — SODIUM CHLORIDE 0.9 % IV SOLN
INTRAVENOUS | Status: DC | PRN
  Administered 2015-12-03: 15:00:00

## 2015-12-03 MED ORDER — SODIUM CHLORIDE 0.9 % IV SOLN
INTRAVENOUS | Status: DC | PRN
  Administered 2015-12-03: 14:00:00 via INTRAVENOUS

## 2015-12-03 MED ORDER — INDOMETHACIN 50 MG RE SUPP
RECTAL | Status: AC
  Filled 2015-12-03: qty 2

## 2015-12-03 MED ORDER — PROPOFOL 10 MG/ML IV BOLUS
INTRAVENOUS | Status: DC | PRN
  Administered 2015-12-03: 150 mg via INTRAVENOUS

## 2015-12-03 MED ORDER — IOPAMIDOL (ISOVUE-300) INJECTION 61%
INTRAVENOUS | Status: AC
  Filled 2015-12-03: qty 50

## 2015-12-03 MED ORDER — ROCURONIUM BROMIDE 100 MG/10ML IV SOLN
INTRAVENOUS | Status: DC | PRN
  Administered 2015-12-03: 20 mg via INTRAVENOUS

## 2015-12-03 MED ORDER — GLUCAGON HCL RDNA (DIAGNOSTIC) 1 MG IJ SOLR
INTRAMUSCULAR | Status: DC | PRN
  Administered 2015-12-03: .5 mg via INTRAVENOUS

## 2015-12-03 MED ORDER — FENTANYL CITRATE (PF) 100 MCG/2ML IJ SOLN
INTRAMUSCULAR | Status: DC | PRN
  Administered 2015-12-03: 50 ug via INTRAVENOUS

## 2015-12-03 MED ORDER — GLUCAGON HCL RDNA (DIAGNOSTIC) 1 MG IJ SOLR
INTRAMUSCULAR | Status: AC
  Filled 2015-12-03: qty 1

## 2015-12-03 MED ORDER — SUCCINYLCHOLINE CHLORIDE 20 MG/ML IJ SOLN
INTRAMUSCULAR | Status: DC | PRN
  Administered 2015-12-03: 120 mg via INTRAVENOUS

## 2015-12-03 NOTE — Anesthesia Postprocedure Evaluation (Signed)
Anesthesia Post Note  Patient: [name]  Procedure(s) Performed: Procedure(s) (LRB): ENDOSCOPIC RETROGRADE CHOLANGIOPANCREATOGRAPHY (ERCP) (N/A)  Patient location during evaluation: PACU Anesthesia Type: General Level of consciousness: awake and alert Pain management: pain level controlled Vital Signs Assessment: post-procedure vital signs reviewed and stable Respiratory status: spontaneous breathing, nonlabored ventilation, respiratory function stable and patient connected to nasal cannula oxygen Cardiovascular status: blood pressure returned to baseline and stable Postop Assessment: no signs of nausea or vomiting Anesthetic complications: no Comments: Pulmonary status is doing well     Last Vitals:  Filed Vitals:   12/03/15 1520 12/03/15 1530  BP: 136/52 140/100  Pulse: 81 78  Temp: 37.2 C   Resp: 16 16    Last Pain:  Filed Vitals:   12/03/15 1544  PainSc: 0-No pain                 Zenaida Deed

## 2015-12-03 NOTE — Anesthesia Procedure Notes (Signed)
Procedure Name: Intubation Date/Time: 12/03/2015 2:14 PM Performed by: Jenne Campus Pre-anesthesia Checklist: Patient identified, Emergency Drugs available, Suction available and Patient being monitored Patient Re-evaluated:Patient Re-evaluated prior to inductionOxygen Delivery Method: Circle System Utilized Preoxygenation: Pre-oxygenation with 100% oxygen Intubation Type: IV induction Ventilation: Mask ventilation without difficulty Laryngoscope Size: Miller and 2 Grade View: Grade I Tube type: Oral Tube size: 7.0 mm Number of attempts: 1 Airway Equipment and Method: Stylet and Oral airway Placement Confirmation: ETT inserted through vocal cords under direct vision,  positive ETCO2 and breath sounds checked- equal and bilateral Secured at: 22 cm Tube secured with: Tape Dental Injury: Teeth and Oropharynx as per pre-operative assessment

## 2015-12-03 NOTE — Transfer of Care (Signed)
Immediate Anesthesia Transfer of Care Note  Patient: Lori Burnett  Procedure(s) Performed: Procedure(s): ENDOSCOPIC RETROGRADE CHOLANGIOPANCREATOGRAPHY (ERCP) (N/A)  Patient Location: Endoscopy Unit  Anesthesia Type:General  Level of Consciousness: awake, alert , oriented and patient cooperative  Airway & Oxygen Therapy: Patient Spontanous Breathing and Patient connected to face mask oxygen  Post-op Assessment: Report given to RN and Post -op Vital signs reviewed and stable  Post vital signs: Reviewed  Last Vitals:  Filed Vitals:   12/03/15 1341 12/03/15 1520  BP: 171/51 136/52  Pulse: 79 81  Temp: 36.8 C 37.2 C  Resp: 17 16    Last Pain:  Filed Vitals:   12/03/15 1523  PainSc: 0-No pain         Complications: No apparent anesthesia complications

## 2015-12-03 NOTE — Anesthesia Preprocedure Evaluation (Addendum)
Anesthesia Evaluation  Patient identified by MRN, date of birth, ID band Patient awake    Reviewed: Allergy & Precautions, H&P , NPO status , Patient's Chart, lab work & pertinent test results  History of Anesthesia Complications Negative for: history of anesthetic complications  Airway Mallampati: II  TM Distance: >3 FB Neck ROM: Limited    Dental  (+) Missing, Poor Dentition, Dental Advisory Given   Pulmonary sleep apnea , COPD,    - rhonchi + decreased breath sounds      Cardiovascular hypertension, Pt. on medications + CAD and +CHF   Rhythm:Regular Rate:Normal     Neuro/Psych VDRF on Dip GTT negative neurological ROS     GI/Hepatic   Endo/Other  diabetes, Type 2Gluc 300 on arrival D 10 infusing on arrival D/C ed as per [facility]  Renal/GU      Musculoskeletal   Abdominal   Peds  Hematology  (+) anemia , Transfused last PM   Anesthesia Other Findings Super morbid obesity, on 2L O2 usually, no issues with anesthesia in past. NPO appropriate.. Denies recent cardiac symptoms or issues  Reproductive/Obstetrics                           Anesthesia Physical  Anesthesia Plan  ASA: III  Anesthesia Plan: General   Post-op Pain Management:    Induction: Intravenous  Airway Management Planned: Oral ETT  Additional Equipment:   Intra-op Plan:   Post-operative Plan:   Informed Consent:   Dental advisory given  Plan Discussed with: CRNA, Anesthesiologist and Surgeon  Anesthesia Plan Comments:        Anesthesia Quick Evaluation

## 2015-12-03 NOTE — Progress Notes (Signed)
Daily Rounding Note  12/03/2015, 9:08 AM  LOS: 1 day   SUBJECTIVE:       Unable to fit in the MRI machine.  Because of previous cardiac stent placement, she requires scanning in the smaller MRI.  She was unable to fit into this machine.  Latest and only pain meds: 4 mg Morphine at 2300 yesterday 6/15. Oliguria, only 950 cc urine reported.  Total I/O: +546.  On LR at 100/hour. Last known EF of 1/205: 65 to [id number], no diastolic dysfunction.  Pt more alert today.  Says pain promarily in L upper and mid abdomen, radiated into back.  No nausea today but + nausea yesterday.  Dtr says mom was fine until 6/14 when pain occurred.    OBJECTIVE:         Vital signs in last 24 hours:    Temp:  [98.4 F (36.9 C)-98.6 F (37 C)] 98.4 F (36.9 C) (06/15 2033) Pulse Rate:  [78-84] 84 (06/16 0038) Resp:  [19-20] 19 (06/16 0038) BP: (113-114)/(46-54) 114/46 mmHg (06/15 2033) SpO2:  [97 %-98 %] 98 % (06/16 0511) Last BM Date: 12/01/15 Filed Weights   12/02/15 0452  Weight: 109.7 kg (241 lb 13.5 oz)   General: awakens more readily.  comfortable   Heart: RRR.  No MRG.   Chest: clear bil.  BiPAP mask in place.  No resp distress or cough, breathing unlabored Abdomen: obese, soft, tender upper abdomen left >> right.  BS hypoactive but not tinkling or tympanitic  Extremities: non-pitting mild/mod swelling in feet, ankles Neuro/Psych:  More alert, voluntarily moved arm away from belly as I started belly exam.  Recalls ambulance transport from [place].   Intake/Output from previous day: 06/15 0701 - 06/16 0700 In: 1496.7 [P.O.:30; I.V.:1316.7; IV Piggyback:150] Out: 950 [Urine:950]  Intake/Output this shift:    Lab Results:  Recent Labs  12/02/15 0923 12/03/15 0649  WBC 20.9* 12.9*  HGB 12.5 12.0  HCT 39.1 37.5  PLT 203 182   BMET  Recent Labs  12/02/15 0923 12/03/15 0649  NA 145 144  K 3.6 3.5  CL 104 110  CO2 28 25    GLUCOSE 177* 104*  BUN 21* 24*  CREATININE 2.63* 2.47*  CALCIUM 8.7* 8.4*   LFT  Recent Labs  12/02/15 0923  PROT 5.8*  ALBUMIN 2.6*  AST 117*  ALT 55*  ALKPHOS 67  BILITOT 3.9*   LFTs of 6/14: 2.1, 81, 69/43.   PT/INR No results for input(s): LABPROT, INR in the last 72 hours. Hepatitis Panel No results for input(s): HEPBSAG, HCVAB, HEPAIGM, HEPBIGM in the last 72 hours.  Studies/Results: No results found.  ASSESMENT:   * Acute pancreatitis, biliary. Lipase 277, much improved c/w > 30K on 6/14.  Clinically looks better.    * Acute cholecystitis. On Zosyn.  WBC improved.    * Rule out choledocholithiasis. Unable to fit into MRI.    *  AKI.  BUN/Creat 19/ 2.1 in [place] 2 days ago.   * OSA, COPD. Previous trach in 2015.BiPAP at HS and currently still in place.   * Altered mental status, improved.  Per daughter. t baseline she is alert, conversant, just forgetful at times.   * Hx breast and colon cancer    PLAN   *  Tentatively set up for ERCP today at 1330, Dr Ardis Hughs may need to perform EUS as well  *  Contact made with gen surg NP who  will see pt.   *  IVF (LR) to 150/hour.   *  FYI: Daughter's name is Jenkins Rouge, sell # is [phone].   *  Lipase and CMET in AM.      Azucena Freed  12/03/2015, 9:08 AM Pager: [phone]

## 2015-12-03 NOTE — Op Note (Signed)
[facility] Patient Name: Lori Burnett Procedure Date : 12/03/2015 MRN: [id number] Attending MD: Milus Banister , MD Date of Birth: 10-02-1935 CSN: [id number] Age: 80 Admit Type: Inpatient Procedure:                ERCP Indications:              Abnormal abdominal ultrasound ("prominant CBD,                            thick GB, + pericholecystic fluid), Elevated liver                            enzymes, elevated WBC Providers:                Milus Banister, MD, Kingsley Plan, RN, Elspeth Cho, Technician, Luciana Axe, CRNA Referring MD:              Medicines:                General Anesthesia, Indomethacin 100 mg PR, zosyn Complications:            No immediate complications. Estimated blood loss:                            None Estimated Blood Loss:     Estimated blood loss: none. Procedure:                Pre-Anesthesia Assessment:                           - Prior to the procedure, a History and Physical                            was performed, and patient medications and                            allergies were reviewed. The patient's tolerance of                            previous anesthesia was also reviewed. The risks                            and benefits of the procedure and the sedation                            options and risks were discussed with the patient.                            All questions were answered, and informed consent                            was obtained. Prior Anticoagulants: The patient has  taken no previous anticoagulant or antiplatelet                            agents. ASA Grade Assessment: III - A patient with                            severe systemic disease. After reviewing the risks                            and benefits, the patient was deemed in                            satisfactory condition to undergo the procedure.                           After  obtaining informed consent, the scope was                            passed under direct vision. Throughout the                            procedure, the patient's blood pressure, pulse, and                            oxygen saturations were monitored continuously. The                            [id number]) scope was introduced through                            the mouth, and used to inject contrast into and                            used to inject contrast into the bile duct. The                            ERCP was accomplished without difficulty. The                            patient tolerated the procedure well. Scope In: Scope Out: Findings:      The scout film was normal. The esophagus was successfully intubated       under direct vision. The scope was advanced to a normal major papilla in       the descending duodenum without detailed examination of the pharynx,       larynx and associated structures, and upper GI tract. The upper GI tract       was grossly normal. There was pus draining from the major papilla prior       to any therapy. A 44 [employer]ot[employer] over a .035 hydrawire was used to       cannulate the bile duct and contrast was injected. an identical wire was       temporarily placed in the pancreatic duct to facilitate biliary       cannulation. Cholangiogram revealed diffusely dilated  extrahepatic bile       duct (CBD 42mm) without obvious strictures. The cystic duct partially       opacified. The gallbladder did not opacify. Biliary sphincterotomy was       made with a traction (standard) sphincterotome. There was no       post-sphincterotomy bleeding. The biliary tree was swept with a 12 mm       balloon several times. Old, purulent bile with some fleck of black stone       debris was swept into the duodenum. A completion, occlusion       cholangiogram showed no filling defects. The main pancreatic duct was       cannulated with wire but never injected with  dye. Impression:               - Purulent cholangitis, small flecks of stone                            debris were removed from the bile duct after                            biliary sphinctertomy and balloon sweeping.                           - Cystic duct partially opacified, gallbladder did                            not opacifiy. The purulence that was delivered into                            the duodenum was out of proportion to the stone                            burden in the CBD (only small flecks of stone                            debris). I am suspicious of acute cholecysitis and                            think the purulence may be from the gallbladder. Recommendation:           - Return patient to hospital ward for ongoing care.                           - General surgery to determine timing of                            gallbladder resection. Procedure Code(s):        --- Professional ---                           [phone], Endoscopic retrograde                            cholangiopancreatography (ERCP); with removal of  calculi/debris from biliary/pancreatic duct(s)                           [phone], Endoscopic retrograde                            cholangiopancreatography (ERCP); with                            sphincterotomy/papillotomy Diagnosis Code(s):        --- Professional ---                           R74.8, Abnormal levels of other serum enzymes                           R93.5, Abnormal findings on diagnostic imaging of                            other abdominal regions, including retroperitoneum CPT copyright 2016 American Medical Association. All rights reserved. The codes documented in this report are preliminary and upon coder review may  be revised to meet current compliance requirements. Milus Banister, MD 12/03/2015 3:04:40 PM This report has been signed electronically. Number of Addenda: 0

## 2015-12-03 NOTE — Progress Notes (Signed)
PROGRESS NOTE    Lori Burnett  [id number] DOB: [birth year] DOA: 12/02/2015 PCP: Normand Sloop, MD   Outpatient Specialists:     Brief Narrative:  Lori Burnett is a 80 y.o. female with medical history significant of DM, HTN, HVOS, CAD. Patient presents to the ED at [facility] with c/o N/V, abdominal pain radiating to both flanks and back. Symptoms onset yesterday morning. Have worsened earlier in the evening which prompted her to go to [facility].  ED Course: Work up in the ED at [facility] demonstrated acute pancreatitis with lipase > 30k, CT scan showed "mild acute pancreatitis", US gallbladder showed distended bile duct, cholecystitis, cholelithiasis, and clinical picture that was suspicious for choledocholithiasis. She also has AKI with creatinine of 2.1, leukocytosis with WBC of 20k. Patient given 1L bolus IVF and Zosyn. Due to patient likely needing MRCP / ERCP, she was transferred to [facility].    Assessment & Plan:   Principal Problem:   Choledocholithiasis with acute cholecystitis with obstruction Active Problems:   OSA (obstructive sleep apnea)   Morbid obesity (HCC)   DM2 (diabetes mellitus, type 2) (HCC)   HTN (hypertension)   Leukocytosis   AKI (acute kidney injury) ([place])   Acute gallstone pancreatitis   Abdominal pain   Choledocholithiasis, with acute cholecystitis, obstruction, and acute gallstone pancreatitis - Zosyn ERCP as patient unable to fit in MRCP machine Morphine PRN pain Zofran PRN nausea per general surgery: proceed with cholecystectomy once pancreatitis resolves  AKI - likely pre-renal in setting of pancreatitis and borderline low BPs. Strict intake and output Hold diuretics and ACEi Not entirely clear on baseline but it sounds like baseline creatinine may be 1.9 per family  HTN - holding BP meds  DM2 - Patient and family are unsure of her home  insulin dosing Will put her on SSI Q4H moderate scale for now as she is NPO, may require upping to resistant scale as she does take 70 units of lantus daily.  Morbid obesity - BMI 70 Was very difficult to wean from vent post op from CABG in 2015, required trach and spending what looks like a month in the LTAC here weaning according to PCCM notes  OSA - BIPAP per home settings at bed time   DVT prophylaxis:  SCD's  Code Status: Full Code   Family Communication: Daughter at bedside  Disposition Plan:     Consultants:   GI  General surgery  Procedures:        Subjective: Still tender in abdominal region  Objective: Filed Vitals:   12/02/15 1437 12/02/15 2033 12/03/15 0038 12/03/15 0511  BP: 113/54 114/46  96/40  Pulse: 78 80 84 62  Temp: 98.6 F (37 C) 98.4 F (36.9 C)  97.9 F (36.6 C)  TempSrc: Oral Oral  Oral  Resp: 20 19 19 19   Height:      Weight:      SpO2: 98% 98% 97% 98%    Intake/Output Summary (Last 24 hours) at 12/03/15 1235 Last data filed at 12/03/15 0631  Gross per 24 hour  Intake 1496.67 ml  Output    950 ml  Net 546.67 ml   Filed Weights   12/02/15 0452  Weight: 109.7 kg (241 lb 13.5 oz)    Examination:  General exam: Appears calm and comfortable - on CPAP Respiratory system: diminished, no wheezing Cardiovascular system: S1 & S2 heard, RRR. No JVD, murmurs, rubs, gallops or clicks. No pedal edema. Gastrointestinal system: tender, obese     Data Reviewed:  I have personally reviewed following labs and imaging studies  CBC:  Recent Labs Lab 12/02/15 0923 12/03/15 0649  WBC 20.9* 12.9*  HGB 12.5 12.0  HCT 39.1 37.5  MCV 89.7 89.1  PLT 203 [id number]   Basic Metabolic Panel:  Recent Labs Lab 12/02/15 0923 12/03/15 0649  NA 145 144  K 3.6 3.5  CL 104 110  CO2 28 25  GLUCOSE 177* 104*  BUN 21* 24*  CREATININE 2.63* 2.47*  CALCIUM 8.7* 8.4*   GFR: Estimated Creatinine Clearance: 22.7  mL/min (by C-G formula based on Cr of 2.47). Liver Function Tests:  Recent Labs Lab 12/02/15 0923 12/03/15 0649  AST 117* 112*  ALT 55* 70*  ALKPHOS 67 98  BILITOT 3.9* 3.5*  PROT 5.8* 5.7*  ALBUMIN 2.6* 2.4*    Recent Labs Lab 12/03/15 0649  LIPASE 277*   No results for input(s): AMMONIA in the last 168 hours. Coagulation Profile: No results for input(s): INR, PROTIME in the last 168 hours. Cardiac Enzymes: No results for input(s): CKTOTAL, CKMB, CKMBINDEX, TROPONINI in the last 168 hours. BNP (last 3 results) No results for input(s): PROBNP in the last 8760 hours. HbA1C: No results for input(s): HGBA1C in the last 72 hours. CBG:  Recent Labs Lab 12/02/15 1937 12/02/15 2357 12/03/15 0428 12/03/15 0753 12/03/15 1159  GLUCAP 100* 107* 119* 117* 112*   Lipid Profile: No results for input(s): CHOL, HDL, LDLCALC, TRIG, CHOLHDL, LDLDIRECT in the last 72 hours. Thyroid Function Tests: No results for input(s): TSH, T4TOTAL, FREET4, T3FREE, THYROIDAB in the last 72 hours. Anemia Panel: No results for input(s): VITAMINB12, FOLATE, FERRITIN, TIBC, IRON, RETICCTPCT in the last 72 hours. Urine analysis:    Component Value Date/Time   COLORURINE AMBER* 12/02/2015 1526   APPEARANCEUR TURBID* 12/02/2015 1526   LABSPEC 1.026 12/02/2015 1526   PHURINE 5.0 12/02/2015 1526   GLUCOSEU NEGATIVE 12/02/2015 1526   HGBUR NEGATIVE 12/02/2015 1526   BILIRUBINUR MODERATE* 12/02/2015 1526   KETONESUR 15* 12/02/2015 1526   PROTEINUR 30* 12/02/2015 1526   UROBILINOGEN 0.2 06/27/2013 0714   NITRITE NEGATIVE 12/02/2015 1526   LEUKOCYTESUR LARGE* 12/02/2015 1526     )No results found for this or any previous visit (from the past 240 hour(s)).    Anti-infectives    Start     Dose/Rate Route Frequency Ordered Stop   12/02/15 1400  piperacillin-tazobactam (ZOSYN) IVPB 3.375 g     3.375 g 12.5 mL/hr over 240 Minutes Intravenous Every 8 hours 12/02/15 0548     12/02/15 0600   Ampicillin-Sulbactam (UNASYN) 3 g in sodium chloride 0.9 % 100 mL IVPB  Status:  Discontinued     3 g 100 mL/hr over 60 Minutes Intravenous Every 8 hours 12/02/15 0514 12/02/15 0534   12/02/15 0600  piperacillin-tazobactam (ZOSYN) IVPB 3.375 g     3.375 g 100 mL/hr over 30 Minutes Intravenous STAT 12/02/15 0548 12/02/15 0640       Radiology Studies: No results found.      Scheduled Meds: . amiodarone  200 mg Oral Daily  . budesonide (PULMICORT) nebulizer solution  0.5 mg Nebulization Daily  . insulin aspart  0-15 Units Subcutaneous Q4H  . montelukast  10 mg Oral QHS  . pantoprazole  40 mg Oral Daily  . PARoxetine  40 mg Oral Daily  . piperacillin-tazobactam (ZOSYN)  IV  3.375 g Intravenous Q8H   Continuous Infusions: . lactated ringers 150 mL/hr at 12/03/15 0850     LOS: 1 day  Time spent: 25 min    [place], DO [facility] Pager [phone]  If 7PM-7AM, please contact night-coverage [email or web address] Password [facility] 12/03/2015, 12:35 PM

## 2015-12-03 NOTE — Care Management Important Message (Signed)
Important Message  Patient Details  Name: Lori Burnett MRN: [id number] Date of Birth: 08/20/1935   Medicare Important Message Given:  Yes    Loann Quill 12/03/2015, 9:34 AM

## 2015-12-03 NOTE — Consult Note (Signed)
Reason for Consult:Choledocholithiasis, gallstone pancreatitis, cholelithiasis  Referring Physician: Dr. Deatra Canter is an 80 y.o. female.  HPI: Patient has a past medical history of colon cancer s/p colectomy, breast cancer s/p left mastectomy, hernia repair x2, CAD, CHF, cabg, diabetes, CKD, obesity, sleep apnea presented to the emergency department in [place], with abdominal pain, nausea, and vomiting that started yesterday. Symptoms have been constant. Time pattern is improving. Nausea and vomiting have resolved. She denies previous symptoms. Denies history of alcohol use or pancreatitis. ED workup of CT abdomen and pelvis indicated mild pancreatitis, choledocholithiasis, pericholecystic fluid, and gallbladder wall thickening. She was found to have a lipase greater than 3000, abnormal LFTs. She was there to be transferred for definitive treatment. Patient was admitted and started on IV fluids and bowel rest. Gastroenterology was consulted, who recommended MRCP. Unfortunately, patient was unable to fit in the MRI machine. Therefore, patient will have ERCP later today. We have been asked to evaluate for cholecystectomy.  Lipase down to 277. LFTs remain elevated with a TB of 3.3 which is trending down.   Patient is poor historian. Her surgeries were done at outside facilities.   Past Medical History  Diagnosis Date  . COPD (chronic obstructive pulmonary disease) ([place])   . Coronary artery disease   . Hypertension   . CHF (congestive heart failure) ([place])   . Pulmonary hypertension ([place])   . PAD (peripheral artery disease) ([place])   . Complication of anesthesia     "problems coming off it when she had her heart OR; had to later get a trach and [facility]"  . High cholesterol   . Anginal pain ([place])   . Asthma   . On home oxygen therapy     "2.5L; 24/7" (12/02/2015)  . Pneumonia 06/2013  . Chronic bronchitis ([place])   . OSA treated with BiPAP   . Type II diabetes mellitus ([place])    . Sickle cell trait ([place])   . History of blood transfusion     "related to colon and heart ORs"  . GERD (gastroesophageal reflux disease)   . Arthritis     "knees" (12/02/2015)  . Anxiety 06/2013  . Depression   . Chronic kidney disease (CKD), stage III (moderate)   . Cancer of left breast ([place])   . Colon cancer [facility])     Past Surgical History  Procedure Laterality Date  . Tracheostomy tube placement N/A 07/03/2013    Procedure: TRACHEOSTOMY;  Surgeon: Rozetta Nunnery, MD;  Location: [facility] OR;  Service: ENT;  Laterality: N/A;  . Esophagogastroduodenoscopy N/A 07/07/2013    Procedure: ESOPHAGOGASTRODUODENOSCOPY (EGD);  Surgeon: Lafayette Dragon, MD;  Location: [facility] ENDOSCOPY;  Service: Endoscopy;  Laterality: N/A;  . Abdominal hysterectomy    . Umbilical hernia repair    . Hernia repair    . Breast biopsy Left   . Mastectomy Left ~ 2014  . Colectomy  ~ 2005  . Coronary artery bypass graft  05/2013    "triple"  . Coronary angioplasty with stent placement  05/2013    History reviewed. No pertinent family history.  Social History:  reports that she has never smoked. She has never used smokeless tobacco. She reports that she does not drink alcohol or use illicit drugs.  Allergies: No Known Allergies  Medications:  Scheduled Meds: . amiodarone  200 mg Oral Daily  . budesonide (PULMICORT) nebulizer solution  0.5 mg Nebulization Daily  . insulin aspart  0-15 Units Subcutaneous Q4H  .  montelukast  10 mg Oral QHS  . pantoprazole  40 mg Oral Daily  . PARoxetine  40 mg Oral Daily  . piperacillin-tazobactam (ZOSYN)  IV  3.375 g Intravenous Q8H   Continuous Infusions: . lactated ringers 150 mL/hr at 12/03/15 0850   PRN Meds:.morphine injection, ondansetron (ZOFRAN) IV   Results for orders placed or performed during the hospital encounter of 12/02/15 (from the past 48 hour(s))  Glucose, capillary     Status: Abnormal   Collection Time: 12/02/15  6:03 AM  Result Value Ref Range    Glucose-Capillary 187 (H) 65 - 99 mg/dL   Comment 1 Notify RN    Comment 2 Document in Chart   Glucose, capillary     Status: Abnormal   Collection Time: 12/02/15  8:01 AM  Result Value Ref Range   Glucose-Capillary 172 (H) 65 - 99 mg/dL  Comprehensive metabolic panel     Status: Abnormal   Collection Time: 12/02/15  9:23 AM  Result Value Ref Range   Sodium 145 135 - 145 mmol/L   Potassium 3.6 3.5 - 5.1 mmol/L   Chloride 104 101 - 111 mmol/L   CO2 28 22 - 32 mmol/L   Glucose, Bld 177 (H) 65 - 99 mg/dL   BUN 21 (H) 6 - 20 mg/dL   Creatinine, Ser 2.63 (H) 0.44 - 1.00 mg/dL   Calcium 8.7 (L) 8.9 - 10.3 mg/dL   Total Protein 5.8 (L) 6.5 - 8.1 g/dL   Albumin 2.6 (L) 3.5 - 5.0 g/dL   AST 117 (H) 15 - 41 U/L   ALT 55 (H) 14 - 54 U/L   Alkaline Phosphatase 67 38 - 126 U/L   Total Bilirubin 3.9 (H) 0.3 - 1.2 mg/dL   GFR calc non Af Amer 16 (L) >60 mL/min   GFR calc Af Amer 19 (L) >60 mL/min    Comment: (NOTE) The eGFR has been calculated using the CKD EPI equation. This calculation has not been validated in all clinical situations. eGFR's persistently <60 mL/min signify possible Chronic Kidney Disease.    Anion gap 13 5 - 15  CBC     Status: Abnormal   Collection Time: 12/02/15  9:23 AM  Result Value Ref Range   WBC 20.9 (H) 4.0 - 10.5 K/uL   RBC 4.36 3.87 - 5.11 MIL/uL   Hemoglobin 12.5 12.0 - 15.0 g/dL   HCT 39.1 36.0 - 46.0 %   MCV 89.7 78.0 - 100.0 fL   MCH 28.7 26.0 - 34.0 pg   MCHC 32.0 30.0 - 36.0 g/dL   RDW 15.2 11.5 - 15.5 %   Platelets 203 150 - 400 K/uL  Glucose, capillary     Status: Abnormal   Collection Time: 12/02/15 11:51 AM  Result Value Ref Range   Glucose-Capillary 148 (H) 65 - 99 mg/dL   Comment 1 Notify RN   Glucose, capillary     Status: Abnormal   Collection Time: 12/02/15  3:16 PM  Result Value Ref Range   Glucose-Capillary 144 (H) 65 - 99 mg/dL  Urinalysis, Routine w reflex microscopic (not at [facility])     Status: Abnormal   Collection Time:  12/02/15  3:26 PM  Result Value Ref Range   Color, Urine AMBER (A) YELLOW    Comment: BIOCHEMICALS MAY BE AFFECTED BY COLOR   APPearance TURBID (A) CLEAR   Specific Gravity, Urine 1.026 1.005 - 1.030   pH 5.0 5.0 - 8.0   Glucose, UA NEGATIVE NEGATIVE mg/dL  Hgb urine dipstick NEGATIVE NEGATIVE   Bilirubin Urine MODERATE (A) NEGATIVE   Ketones, ur 15 (A) NEGATIVE mg/dL   Protein, ur 30 (A) NEGATIVE mg/dL   Nitrite NEGATIVE NEGATIVE   Leukocytes, UA LARGE (A) NEGATIVE  Urine microscopic-add on     Status: Abnormal   Collection Time: 12/02/15  3:26 PM  Result Value Ref Range   Squamous Epithelial / LPF 6-30 (A) NONE SEEN   WBC, UA TOO NUMEROUS TO COUNT 0 - 5 WBC/hpf   RBC / HPF 6-30 0 - 5 RBC/hpf   Bacteria, UA MANY (A) NONE SEEN  Glucose, capillary     Status: Abnormal   Collection Time: 12/02/15  7:37 PM  Result Value Ref Range   Glucose-Capillary 100 (H) 65 - 99 mg/dL   Comment 1 Notify RN    Comment 2 Document in Chart   Glucose, capillary     Status: Abnormal   Collection Time: 12/02/15 11:57 PM  Result Value Ref Range   Glucose-Capillary 107 (H) 65 - 99 mg/dL  Glucose, capillary     Status: Abnormal   Collection Time: 12/03/15  4:28 AM  Result Value Ref Range   Glucose-Capillary 119 (H) 65 - 99 mg/dL  CBC     Status: Abnormal   Collection Time: 12/03/15  6:49 AM  Result Value Ref Range   WBC 12.9 (H) 4.0 - 10.5 K/uL   RBC 4.21 3.87 - 5.11 MIL/uL   Hemoglobin 12.0 12.0 - 15.0 g/dL   HCT 37.5 36.0 - 46.0 %   MCV 89.1 78.0 - 100.0 fL   MCH 28.5 26.0 - 34.0 pg   MCHC 32.0 30.0 - 36.0 g/dL   RDW 15.6 (H) 11.5 - 15.5 %   Platelets 182 150 - 400 K/uL  Lipase, blood     Status: Abnormal   Collection Time: 12/03/15  6:49 AM  Result Value Ref Range   Lipase 277 (H) 11 - 51 U/L  Comprehensive metabolic panel Once     Status: Abnormal   Collection Time: 12/03/15  6:49 AM  Result Value Ref Range   Sodium 144 135 - 145 mmol/L   Potassium 3.5 3.5 - 5.1 mmol/L   Chloride  110 101 - 111 mmol/L   CO2 25 22 - 32 mmol/L   Glucose, Bld 104 (H) 65 - 99 mg/dL   BUN 24 (H) 6 - 20 mg/dL   Creatinine, Ser 2.47 (H) 0.44 - 1.00 mg/dL   Calcium 8.4 (L) 8.9 - 10.3 mg/dL   Total Protein 5.7 (L) 6.5 - 8.1 g/dL   Albumin 2.4 (L) 3.5 - 5.0 g/dL   AST 112 (H) 15 - 41 U/L   ALT 70 (H) 14 - 54 U/L   Alkaline Phosphatase 98 38 - 126 U/L   Total Bilirubin 3.5 (H) 0.3 - 1.2 mg/dL   GFR calc non Af Amer 18 (L) >60 mL/min   GFR calc Af Amer 21 (L) >60 mL/min    Comment: (NOTE) The eGFR has been calculated using the CKD EPI equation. This calculation has not been validated in all clinical situations. eGFR's persistently <60 mL/min signify possible Chronic Kidney Disease.    Anion gap 9 5 - 15  Glucose, capillary     Status: Abnormal   Collection Time: 12/03/15  7:53 AM  Result Value Ref Range   Glucose-Capillary 117 (H) 65 - 99 mg/dL    No results found.  Review of Systems  Constitutional: Negative for fever, chills and weight  loss.  Eyes: Positive for blurred vision.  Respiratory: Negative for shortness of breath.   Cardiovascular: Negative for chest pain and palpitations.  Gastrointestinal: Positive for nausea, vomiting and abdominal pain. Negative for diarrhea and constipation.  Genitourinary: Negative for dysuria.  Musculoskeletal: Positive for back pain.  Neurological: Negative for seizures, loss of consciousness and headaches.   Blood pressure 114/46, pulse 84, temperature 98.4 F (36.9 C), temperature source Oral, resp. rate 19, height 5' 3"  (1.6 m), weight 109.7 kg (241 lb 13.5 oz), SpO2 98 %. Physical Exam  Constitutional: She is oriented to person, place, and time. She appears well-developed and well-nourished. No distress.  HENT:  Head: Normocephalic and atraumatic.  Neck: Normal range of motion.  Cardiovascular: Normal rate, regular rhythm, normal heart sounds and intact distal pulses.  Exam reveals no gallop and no friction rub.   No murmur  heard. Respiratory: Effort normal and breath sounds normal. No respiratory distress. She has no wheezes. She has no rales. She exhibits no tenderness.  GI: Soft. Bowel sounds are normal. She exhibits no distension and no mass. There is tenderness. There is no rebound and no guarding.  TTP in LUQ and RUQ  Musculoskeletal: She exhibits no edema.  Neurological: She is alert and oriented to person, place, and time.  Skin: Skin is warm and dry. She is not diaphoretic.  Psychiatric: She has a normal mood and affect.    Assessment/Plan: Choledocholithiasis, pancreatitis, cholelithiasis (possible cholecystitis on CT) Patient will go for ERCP today.  Lipase is down to 277. She remains fairly tender on exam.  Recommendations are to proceed with cholecystectomy once pancreatitis resolves. We made need to ask cardiology to clear her prior to surgery. Thank you for the consult. We will continue to follow.   Erby Pian 12/03/2015, 11:48 AM

## 2015-12-03 NOTE — Progress Notes (Signed)
According to [place] MRI tech the patient didn't do the MRCP because the pt unable to FIT in the machine, informed the on call MD Baltazar Najjar.

## 2015-12-03 NOTE — Progress Notes (Signed)
Placed patient on CPAP for the night via auto-mode with minimum pressure set at 6cm and maximum pressure set at 20cm. Oxygen set at 2lpm  

## 2015-12-04 ENCOUNTER — Inpatient Hospital Stay (HOSPITAL_COMMUNITY): Payer: Medicare Other

## 2015-12-04 ENCOUNTER — Other Ambulatory Visit: Payer: Self-pay

## 2015-12-04 ENCOUNTER — Encounter (HOSPITAL_COMMUNITY): Payer: Self-pay | Admitting: Internal Medicine

## 2015-12-04 DIAGNOSIS — I25810 Atherosclerosis of coronary artery bypass graft(s) without angina pectoris: Secondary | ICD-10-CM

## 2015-12-04 DIAGNOSIS — Z01810 Encounter for preprocedural cardiovascular examination: Secondary | ICD-10-CM

## 2015-12-04 LAB — COMPREHENSIVE METABOLIC PANEL
ALK PHOS: 174 U/L — AB (ref 38–126)
ALT: 64 U/L — AB (ref 14–54)
AST: 117 U/L — AB (ref 15–41)
Albumin: 2.3 g/dL — ABNORMAL LOW (ref 3.5–5.0)
Anion gap: 9 (ref 5–15)
BUN: 22 mg/dL — AB (ref 6–20)
CALCIUM: 8.4 mg/dL — AB (ref 8.9–10.3)
CHLORIDE: 109 mmol/L (ref 101–111)
CO2: 25 mmol/L (ref 22–32)
CREATININE: 1.98 mg/dL — AB (ref 0.44–1.00)
GFR, EST AFRICAN AMERICAN: 27 mL/min — AB (ref >60)
GFR, EST NON AFRICAN AMERICAN: 23 mL/min — AB (ref >60)
Glucose, Bld: 107 mg/dL — ABNORMAL HIGH (ref 65–99)
Potassium: 5.4 mmol/L — ABNORMAL HIGH (ref 3.5–5.1)
SODIUM: 143 mmol/L (ref 135–145)
Total Bilirubin: 5.1 mg/dL — ABNORMAL HIGH (ref 0.3–1.2)
Total Protein: 5.3 g/dL — ABNORMAL LOW (ref 6.5–8.1)

## 2015-12-04 LAB — GLUCOSE, CAPILLARY
GLUCOSE-CAPILLARY: 116 mg/dL — AB (ref 65–99)
GLUCOSE-CAPILLARY: 118 mg/dL — AB (ref 65–99)
GLUCOSE-CAPILLARY: 124 mg/dL — AB (ref 65–99)
Glucose-Capillary: 116 mg/dL — ABNORMAL HIGH (ref 65–99)
Glucose-Capillary: 125 mg/dL — ABNORMAL HIGH (ref 65–99)
Glucose-Capillary: 107 mg/dL — ABNORMAL HIGH (ref 65–99)

## 2015-12-04 LAB — LIPASE, BLOOD: Lipase: 91 U/L — ABNORMAL HIGH (ref 11–51)

## 2015-12-04 MED ORDER — CETYLPYRIDINIUM CHLORIDE 0.05 % MT LIQD
7.0000 mL | Freq: Two times a day (BID) | OROMUCOSAL | Status: DC
  Administered 2015-12-04 – 2015-12-09 (×10): 7 mL via OROMUCOSAL

## 2015-12-04 NOTE — Progress Notes (Signed)
Placed patient on CPAP for the night via auto-mode with minimum pressure set at 6cm and maximum pressure set at 20cm. Oxygen set at 3lpm

## 2015-12-04 NOTE — Consult Note (Signed)
CARDIOLOGY CONSULT NOTE     Primary Care Physician: Normand Sloop, MD Referring Physician:  Dr Eliseo Squires  Primary Cardiologist:  Dr Janith Lima in [place] Date: 12/02/2015  Reason for consultation:  CAD  Lori Burnett is a 80 y.o. female with a h/o morbid obesity, chronic lung disease on home O2, CAD s/p CABG, colon cancer s/p colectomy, breast cancer s/p left mastectomy, hernia repair x2, diabetes, CKD, and sleep apnea who presents on transfer from [facility] for further management of N/V.  She has been found to have acute pancreatitis, choledocholithiasis, pericholecystic fluid, and gallbladder wall thickening.  She has been evaluated by surgery and is being considered for cholecystectomy.   The patient is a poor historian and not able to provide significant information regarding her cardiac history.  Her primary cardiologist is Dr Janith Lima in [place].  Per report, she had CABG 2014 and had difficulty weaning from the vent afterwards. She is not very active.  She has chronic SOB and in on home O2.   Today, she denies symptoms of palpitations, chest pain, orthopnea, PND, lower extremity edema, dizziness, presyncope, syncope, or neurologic sequela. The patient is tolerating medications without difficulties and is otherwise without complaint today.   Past Medical History  Diagnosis Date  . COPD (chronic obstructive pulmonary disease) ([place])   . Coronary artery disease   . Hypertension   . CHF (congestive heart failure) ([place])   . Pulmonary hypertension ([place])   . PAD (peripheral artery disease) ([place])   . Complication of anesthesia     "problems coming off it when she had her heart OR; had to later get a trach and go to [facility]"  . High cholesterol   . Asthma   . On home oxygen therapy     "2.5L; 24/7" (12/02/2015)  . Pneumonia 06/2013  . Chronic bronchitis ([place])   . OSA treated with BiPAP   . Type II diabetes mellitus ([place])   . Sickle cell trait ([place])   . History of blood  transfusion     "related to colon and heart ORs"  . GERD (gastroesophageal reflux disease)   . Arthritis     "knees" (12/02/2015)  . Anxiety 06/2013  . Depression   . Chronic kidney disease (CKD), stage III (moderate)   . Cancer of left breast ([place])   . Colon cancer ([place])   . CAD (coronary artery disease)    Past Surgical History  Procedure Laterality Date  . Tracheostomy tube placement N/A 07/03/2013    Procedure: TRACHEOSTOMY;  Surgeon: Rozetta Nunnery, MD;  Location: [facility] OR;  Service: ENT;  Laterality: N/A;  . Esophagogastroduodenoscopy N/A 07/07/2013    Procedure: ESOPHAGOGASTRODUODENOSCOPY (EGD);  Surgeon: Lafayette Dragon, MD;  Location: [facility] ENDOSCOPY;  Service: Endoscopy;  Laterality: N/A;  . Abdominal hysterectomy    . Umbilical hernia repair    . Hernia repair    . Breast biopsy Left   . Mastectomy Left ~ 2014  . Colectomy  ~ 2005  . Coronary artery bypass graft  05/2013    "triple"  . Coronary angioplasty with stent placement  05/2013    . amiodarone  200 mg Oral Daily  . antiseptic oral rinse  7 mL Mouth Rinse BID  . budesonide (PULMICORT) nebulizer solution  0.5 mg Nebulization Daily  . insulin aspart  0-15 Units Subcutaneous Q4H  . montelukast  10 mg Oral QHS  . pantoprazole  40 mg Oral Daily  . PARoxetine  40 mg Oral  Daily  . piperacillin-tazobactam (ZOSYN)  IV  3.375 g Intravenous Q8H   . lactated ringers 150 mL/hr at 12/04/15 1245    No Known Allergies  Social History   Social History  . Marital Status: Legally Separated    Spouse Name: N/A  . Number of Children: N/A  . Years of Education: N/A   Occupational History  . Not on file.   Social History Main Topics  . Smoking status: Never Smoker   . Smokeless tobacco: Never Used  . Alcohol Use: No  . Drug Use: No  . Sexual Activity: Not on file   Other Topics Concern  . Not on file   Social History Narrative    Family History  Problem Relation Age of Onset  . Hypertension      ROS- All  systems are reviewed and negative except as per the HPI above  Physical Exam: Telemetry: Filed Vitals:   12/03/15 2244 12/04/15 0404 12/04/15 0843 12/04/15 1246  BP:  135/43  154/81  Pulse: 85 85  73  Temp:  98.5 F (36.9 C)  98.2 F (36.8 C)  TempSrc:  Axillary  Oral  Resp: 18 18  18   Height:      Weight:      SpO2: 100% 98% 97% 100%    GEN- The patient is morbidly obese and chronically ill appearing, alert and oriented x 3 today.   Head- normocephalic, atraumatic Eyes-  Sclera clear, conjunctiva pink Ears- hearing intact Oropharynx- clear Neck- supple,   Lungs- Clear to ausculation bilaterally, normal work of breathing Heart- Regular rate and rhythm  GI- soft, NT, ND, + BS Extremities- no clubbing, cyanosis,trace edema MS- no significant deformity or atrophy Skin- no rash or lesion Psych- euthymic mood, full affect Neuro- strength and sensation are intact  EKG: 06/27/13 sinus rhythm, incomplete RBBB  Labs:   Lab Results  Component Value Date   WBC 12.9* 12/03/2015   HGB 12.0 12/03/2015   HCT 37.5 12/03/2015   MCV 89.1 12/03/2015   PLT 182 12/03/2015    Recent Labs Lab 12/04/15 0340  NA 143  K 5.4*  CL 109  CO2 25  BUN 22*  CREATININE 1.98*  CALCIUM 8.4*  PROT 5.3*  BILITOT 5.1*  ALKPHOS 174*  ALT 64*  AST 117*  GLUCOSE 107*   No results found for: CKTOTAL, CKMB, CKMBINDEX, TROPONINI No results found for: CHOL No results found for: HDL No results found for: [facility] Lab Results  Component Value Date   TRIG 89 07/14/2013   No results found for: CHOLHDL No results found for: LDLDIRECT    Radiology: no recent CXR  Echo: pending  ASSESSMENT AND PLAN:   1. CAD The patient has a h/o CAD with prior CABG.  She is very limited in her activity.  Denies chest pain.  Has chronic lung disease with SOB and home O2 requirement.  Further CV history is unavailable. Does not have up to date ekg.  I have ordered.  Will also order echo to evaluate EF. She is  not currently on CV medications.  I suspect beta blocker may be limited by lung disease and Ace inhibitor by renal disease.  She is on amiodarone for unknown reasons.  We will need to obtain records from Dr Richrd Sox office when able.  2. preop The patient is at high risk for any procedures given chronic lung disease on home O2, morbid obesity, renal failure, etc. I doubt that we can reduce her surgical risks  overall. Will obtain ekg, cxr, echo, and records from primary cardiologist in [facility]  Cardiology to follow   Thompson Grayer, MD 12/04/2015  2:55 PM

## 2015-12-04 NOTE — Progress Notes (Signed)
1 Day Post-Op  Subjective: Pt with NAE overnight Feels a little better this AM  Objective: Vital signs in last 24 hours: Temp:  [98 F (36.7 C)-99.5 F (37.5 C)] 98.5 F (36.9 C) (06/17 0404) Pulse Rate:  [73-85] 85 (06/17 0404) Resp:  [16-24] 18 (06/17 0404) BP: (107-172)/(42-100) 135/43 mmHg (06/17 0404) SpO2:  [97 %-100 %] 97 % (06/17 0843) Weight:  [109.317 kg (241 lb)] 109.317 kg (241 lb) (06/16 1341) Last BM Date: 12/01/15  Intake/Output from previous day: 06/16 0701 - 06/17 0700 In: 3162 [I.V.:3162] Out: 601 [Urine:600; Blood:1] Intake/Output this shift:    General appearance: alert and cooperative Resp: CPAP in place GI: soft, non-tender; bowel sounds normal; no masses,  no organomegaly  Lab Results:   Recent Labs  12/02/15 0923 12/03/15 0649  WBC 20.9* 12.9*  HGB 12.5 12.0  HCT 39.1 37.5  PLT 203 182   BMET  Recent Labs  12/03/15 0649 12/04/15 0340  NA 144 143  K 3.5 5.4*  CL 110 109  CO2 25 25  GLUCOSE 104* 107*  BUN 24* 22*  CREATININE 2.47* 1.98*  CALCIUM 8.4* 8.4*   PT/INR No results for input(s): LABPROT, INR in the last 72 hours. ABG No results for input(s): PHART, HCO3 in the last 72 hours.  Invalid input(s): PCO2, PO2  Studies/Results: Dg Ercp Biliary & Pancreatic Ducts  12/03/2015  CLINICAL DATA:  ERCP EXAM: ERCP TECHNIQUE: Multiple spot images obtained with the fluoroscopic device and submitted for interpretation post-procedure. FLUOROSCOPY TIME:  Radiation Exposure Index (as provided by the fluoroscopic device): If the device does not provide the exposure index: Fluoroscopy Time:  3 minutes and 8 seconds Number of Acquired Images:  For images COMPARISON:  None. FINDINGS: Contrast fills the common bile duct. A balloon has been utilized to sweep the common bile duct. IMPRESSION: See above. These images were submitted for radiologic interpretation only. Please see the procedural report for the amount of contrast and the fluoroscopy time  utilized. Electronically Signed   By: Marybelle Killings M.D.   On: 12/03/2015 16:48    Anti-infectives: Anti-infectives    Start     Dose/Rate Route Frequency Ordered Stop   12/02/15 1400  piperacillin-tazobactam (ZOSYN) IVPB 3.375 g     3.375 g 12.5 mL/hr over 240 Minutes Intravenous Every 8 hours 12/02/15 0548     12/02/15 0600  Ampicillin-Sulbactam (UNASYN) 3 g in sodium chloride 0.9 % 100 mL IVPB  Status:  Discontinued     3 g 100 mL/hr over 60 Minutes Intravenous Every 8 hours 12/02/15 0514 12/02/15 0534   12/02/15 0600  piperacillin-tazobactam (ZOSYN) IVPB 3.375 g     3.375 g 100 mL/hr over 30 Minutes Intravenous STAT 12/02/15 0548 12/02/15 0640      Assessment/Plan: Choledocholithiasis, pancreatitis, cholelithiasis (possible cholecystitis on CT) Lipase trending down Pt with significant comorbidities.  Will need cardiac clearance prior to Surgery.   Con't abx   LOS: 2 days    Rosario Jacks., [facility] 12/04/2015

## 2015-12-04 NOTE — Progress Notes (Signed)
PROGRESS NOTE    Lori Burnett  [id number] DOB: Aug 24, 1935 DOA: 12/02/2015 PCP: Normand Sloop, MD   Outpatient Specialists:     Brief Narrative:  Lori Burnett is a 80 y.o. female with medical history significant of DM, HTN, HVOS, CAD. Patient presents to the ED at [address] with c/o N/V, abdominal pain radiating to both flanks and back. Symptoms onset yesterday morning. Have worsened earlier in the evening which prompted her to go to [facility].  ED Course: Work up in the ED at [facility] demonstrated acute pancreatitis with lipase > 30k, CT scan showed "mild acute pancreatitis", US gallbladder showed distended bile duct, cholecystitis, cholelithiasis, and clinical picture that was suspicious for choledocholithiasis. She also has AKI with creatinine of 2.1, leukocytosis with WBC of 20k. Patient given 1L bolus IVF and Zosyn. Due to patient likely needing MRCP / ERCP, she was transferred to [facility].    Assessment & Plan:   Principal Problem:   Choledocholithiasis with acute cholecystitis with obstruction Active Problems:   OSA (obstructive sleep apnea)   Morbid obesity (HCC)   DM2 (diabetes mellitus, type 2) (HCC)   HTN (hypertension)   Leukocytosis   AKI (acute kidney injury) ([place])   Acute gallstone pancreatitis   Abdominal pain   Choledocholithiasis, with acute cholecystitis, obstruction, and acute gallstone pancreatitis - Zosyn ERCP as patient unable to fit in MRCP machine- done 6/16 Purulent cholangitis, small flecks of stonedebris were removed from  the bile duct after biliary sphinctertomy and balloon sweeping. Morphine PRN pain Zofran PRN nausea per general surgery: proceed with cholecystectomy once pancreatitis resolves--  Consulted card-- echo order-- patient most likely will be high risk from both pulm and cardiology standpoint-- had CABG in 2014 and was not able to be extubated and spent > 1 [facility] wit  trach  AKI - likely pre-renal in setting of pancreatitis and borderline low BPs. Strict intake and output Hold diuretics and ACEi Not entirely clear on baseline but it sounds like baseline creatinine may be 1.9 per family  HTN - holding BP meds  DM2 - Patient and family are unsure of her home insulin dosing Will put her on SSI Q4H moderate scale for now as she is NPO, may require upping to resistant scale as she does take 70 units of lantus daily.  Morbid obesity - BMI 28 Was very difficult to wean from vent post op from CABG in 2015, required trach and spending what looks like a month in the LTAC here weaning according to PCCM notes  OSA - BIPAP per home settings at bed time  Chronic respiratory failure -wears 2.5L O2 at home  Hyperkalemia -has hemolysis  DVT prophylaxis:  SCD's  Code Status: Full Code   Family Communication: Daughter at bedside  Disposition Plan:     Consultants:   GI  General surgery  cardiology  Procedures:        Subjective: Asking for ice chips  Objective: Filed Vitals:   12/03/15 2244 12/04/15 0404 12/04/15 0843 12/04/15 1246  BP:  135/43  154/81  Pulse: 85 85  73  Temp:  98.5 F (36.9 C)  98.2 F (36.8 C)  TempSrc:  Axillary  Oral  Resp: 18 18  18   Height:      Weight:      SpO2: 100% 98% 97% 100%    Intake/Output Summary (Last 24 hours) at 12/04/15 1442 Last data filed at 12/04/15 1253  Gross per 24 hour  Intake   3162 ml  Output  801 ml  Net   2361 ml   Filed Weights   12/02/15 0452 12/03/15 1341  Weight: 109.7 kg (241 lb 13.5 oz) 109.317 kg (241 lb)    Examination:  General exam: Appears calm and comfortable -wears 2.5L O2 at home Respiratory system: diminished, no wheezing Cardiovascular system: S1 & S2 heard, RRR. No JVD, murmurs, rubs, gallops or clicks. No pedal edema. Gastrointestinal system: tender, obese     Data  Reviewed: I have personally reviewed following labs and imaging studies  CBC:  Recent Labs Lab 12/02/15 0923 12/03/15 0649  WBC 20.9* 12.9*  HGB 12.5 12.0  HCT 39.1 37.5  MCV 89.7 89.1  PLT 203 [id number]   Basic Metabolic Panel:  Recent Labs Lab 12/02/15 0923 12/03/15 0649 12/04/15 0340  NA 145 144 143  K 3.6 3.5 5.4*  CL 104 110 109  CO2 28 25 25   GLUCOSE 177* 104* 107*  BUN 21* 24* 22*  CREATININE 2.63* 2.47* 1.98*  CALCIUM 8.7* 8.4* 8.4*   GFR: Estimated Creatinine Clearance: 28.2 mL/min (by C-G formula based on Cr of 1.98). Liver Function Tests:  Recent Labs Lab 12/02/15 0923 12/03/15 0649 12/04/15 0340  AST 117* 112* 117*  ALT 55* 70* 64*  ALKPHOS 67 98 174*  BILITOT 3.9* 3.5* 5.1*  PROT 5.8* 5.7* 5.3*  ALBUMIN 2.6* 2.4* 2.3*    Recent Labs Lab 12/03/15 0649 12/04/15 0340  LIPASE 277* 91*   No results for input(s): AMMONIA in the last 168 hours. Coagulation Profile: No results for input(s): INR, PROTIME in the last 168 hours. Cardiac Enzymes: No results for input(s): CKTOTAL, CKMB, CKMBINDEX, TROPONINI in the last 168 hours. BNP (last 3 results) No results for input(s): PROBNP in the last 8760 hours. HbA1C: No results for input(s): HGBA1C in the last 72 hours. CBG:  Recent Labs Lab 12/03/15 1941 12/03/15 2344 12/04/15 0344 12/04/15 0829 12/04/15 1159  GLUCAP 121* 121* 116* 125* 107*   Lipid Profile: No results for input(s): CHOL, HDL, LDLCALC, TRIG, CHOLHDL, LDLDIRECT in the last 72 hours. Thyroid Function Tests: No results for input(s): TSH, T4TOTAL, FREET4, T3FREE, THYROIDAB in the last 72 hours. Anemia Panel: No results for input(s): VITAMINB12, FOLATE, FERRITIN, TIBC, IRON, RETICCTPCT in the last 72 hours. Urine analysis:    Component Value Date/Time   COLORURINE AMBER* 12/02/2015 1526   APPEARANCEUR TURBID* 12/02/2015 1526   LABSPEC 1.026 12/02/2015 1526   PHURINE 5.0 12/02/2015 1526   GLUCOSEU NEGATIVE 12/02/2015 1526   HGBUR  NEGATIVE 12/02/2015 1526   BILIRUBINUR MODERATE* 12/02/2015 1526   KETONESUR 15* 12/02/2015 1526   PROTEINUR 30* 12/02/2015 1526   UROBILINOGEN 0.2 06/27/2013 0714   NITRITE NEGATIVE 12/02/2015 1526   LEUKOCYTESUR LARGE* 12/02/2015 1526     )No results found for this or any previous visit (from the past 240 hour(s)).    Anti-infectives    Start     Dose/Rate Route Frequency Ordered Stop   12/02/15 1400  piperacillin-tazobactam (ZOSYN) IVPB 3.375 g     3.375 g 12.5 mL/hr over 240 Minutes Intravenous Every 8 hours 12/02/15 0548     12/02/15 0600  Ampicillin-Sulbactam (UNASYN) 3 g in sodium chloride 0.9 % 100 mL IVPB  Status:  Discontinued     3 g 100 mL/hr over 60 Minutes Intravenous Every 8 hours 12/02/15 0514 12/02/15 0534   12/02/15 0600  piperacillin-tazobactam (ZOSYN) IVPB 3.375 g     3.375 g 100 mL/hr over 30 Minutes Intravenous STAT 12/02/15 0548 12/02/15 0640  Radiology Studies: Dg Ercp Biliary & Pancreatic Ducts  12/03/2015  CLINICAL DATA:  ERCP EXAM: ERCP TECHNIQUE: Multiple spot images obtained with the fluoroscopic device and submitted for interpretation post-procedure. FLUOROSCOPY TIME:  Radiation Exposure Index (as provided by the fluoroscopic device): If the device does not provide the exposure index: Fluoroscopy Time:  3 minutes and 8 seconds Number of Acquired Images:  For images COMPARISON:  None. FINDINGS: Contrast fills the common bile duct. A balloon has been utilized to sweep the common bile duct. IMPRESSION: See above. These images were submitted for radiologic interpretation only. Please see the procedural report for the amount of contrast and the fluoroscopy time utilized. Electronically Signed   By: Marybelle Killings M.D.   On: 12/03/2015 16:48        Scheduled Meds: . amiodarone  200 mg Oral Daily  . antiseptic oral rinse  7 mL Mouth Rinse BID  . budesonide (PULMICORT) nebulizer solution  0.5 mg Nebulization Daily  . insulin aspart  0-15 Units  Subcutaneous Q4H  . montelukast  10 mg Oral QHS  . pantoprazole  40 mg Oral Daily  . PARoxetine  40 mg Oral Daily  . piperacillin-tazobactam (ZOSYN)  IV  3.375 g Intravenous Q8H   Continuous Infusions: . lactated ringers 150 mL/hr at 12/04/15 1245     LOS: 2 days    Time spent: 25 min    [place], DO [facility] Pager [phone]  If 7PM-7AM, please contact night-coverage [email or web address] Password [email or web address] 12/04/2015, 2:42 PM

## 2015-12-04 NOTE — Progress Notes (Signed)
Family at bedside visiting.  Oral protocol started since mouth is dry and pt NPO for now.  Some tightness noted LUE, will place on pillow.

## 2015-12-05 DIAGNOSIS — I25810 Atherosclerosis of coronary artery bypass graft(s) without angina pectoris: Secondary | ICD-10-CM | POA: Insufficient documentation

## 2015-12-05 DIAGNOSIS — G4733 Obstructive sleep apnea (adult) (pediatric): Secondary | ICD-10-CM

## 2015-12-05 DIAGNOSIS — Z01810 Encounter for preprocedural cardiovascular examination: Secondary | ICD-10-CM | POA: Insufficient documentation

## 2015-12-05 LAB — COMPREHENSIVE METABOLIC PANEL
ALT: 53 U/L (ref 14–54)
AST: 60 U/L — AB (ref 15–41)
Albumin: 2.1 g/dL — ABNORMAL LOW (ref 3.5–5.0)
Alkaline Phosphatase: 206 U/L — ABNORMAL HIGH (ref 38–126)
Anion gap: 10 (ref 5–15)
BILIRUBIN TOTAL: 3 mg/dL — AB (ref 0.3–1.2)
BUN: 13 mg/dL (ref 6–20)
CALCIUM: 9 mg/dL (ref 8.9–10.3)
CO2: 28 mmol/L (ref 22–32)
CREATININE: 1.52 mg/dL — AB (ref 0.44–1.00)
Chloride: 110 mmol/L (ref 101–111)
GFR calc Af Amer: 37 mL/min — ABNORMAL LOW (ref >60)
GFR, EST NON AFRICAN AMERICAN: 32 mL/min — AB (ref >60)
GLUCOSE: 119 mg/dL — AB (ref 65–99)
Potassium: 3.7 mmol/L (ref 3.5–5.1)
Sodium: 148 mmol/L — ABNORMAL HIGH (ref 135–145)
Total Protein: 5.6 g/dL — ABNORMAL LOW (ref 6.5–8.1)

## 2015-12-05 LAB — GLUCOSE, CAPILLARY
GLUCOSE-CAPILLARY: 117 mg/dL — AB (ref 65–99)
GLUCOSE-CAPILLARY: 124 mg/dL — AB (ref 65–99)
GLUCOSE-CAPILLARY: 130 mg/dL — AB (ref 65–99)
Glucose-Capillary: 138 mg/dL — ABNORMAL HIGH (ref 65–99)
Glucose-Capillary: 117 mg/dL — ABNORMAL HIGH (ref 65–99)

## 2015-12-05 LAB — CBC
HEMATOCRIT: 39.2 % (ref 36.0–46.0)
Hemoglobin: 12.6 g/dL (ref 12.0–15.0)
MCH: 28.6 pg (ref 26.0–34.0)
MCHC: 32.1 g/dL (ref 30.0–36.0)
MCV: 88.9 fL (ref 78.0–100.0)
Platelets: 182 10*3/uL (ref 150–400)
RBC: 4.41 MIL/uL (ref 3.87–5.11)
RDW: 15.5 % (ref 11.5–15.5)
WBC: 13.5 10*3/uL — AB (ref 4.0–10.5)

## 2015-12-05 MED ORDER — SODIUM CHLORIDE 0.45 % IV SOLN
INTRAVENOUS | Status: DC
  Administered 2015-12-05 – 2015-12-07 (×2): via INTRAVENOUS

## 2015-12-05 NOTE — Progress Notes (Signed)
PROGRESS NOTE    Banelly Rosenburg  [id number] DOB: 08-05-1935 DOA: 12/02/2015 PCP: Normand Sloop, MD   Outpatient Specialists:     Brief Narrative:  Lori Burnett is a 80 y.o. female with medical history significant of DM, HTN, HVOS, CAD. Patient presents to the ED at [facility] with c/o N/V, abdominal pain radiating to both flanks and back. Symptoms onset yesterday morning. Have worsened earlier in the evening which prompted her to go to [facility].  ED Course: Work up in the ED at [facility] demonstrated acute pancreatitis with lipase > 30k, CT scan showed "mild acute pancreatitis", US gallbladder showed distended bile duct, cholecystitis, cholelithiasis, and clinical picture that was suspicious for choledocholithiasis. She also has AKI with creatinine of 2.1, leukocytosis with WBC of 20k. Patient given 1L bolus IVF and Zosyn. Due to patient likely needing MRCP / ERCP, she was transferred to [facility].    Assessment & Plan:   Principal Problem:   Choledocholithiasis with acute cholecystitis with obstruction Active Problems:   OSA (obstructive sleep apnea)   Morbid obesity (HCC)   DM2 (diabetes mellitus, type 2) (HCC)   HTN (hypertension)   Leukocytosis   AKI (acute kidney injury) ([place])   Acute gallstone pancreatitis   Abdominal pain   Choledocholithiasis, with acute cholecystitis, obstruction, and acute gallstone pancreatitis - Zosyn ERCP as patient unable to fit in MRCP machine- done 6/16 Purulent cholangitis, small flecks of stonedebris were removed from  the bile duct after biliary sphinctertomy and balloon sweeping. Morphine PRN pain Zofran PRN nausea per general surgery: proceed with cholecystectomy once pancreatitis resolves vs conservative management--  Consulted card-- echo order-- patient most likely will be high risk from both pulm and cardiology standpoint-- had CABG in 2014 and was not able to be extubated and spent [facility] 1  [facility]  AKI - likely pre-renal in setting of pancreatitis and borderline low BPs. Strict intake and output Hold diuretics and ACEi Not entirely clear on baseline but it sounds like baseline creatinine may be 1.9 per family  Hypernatremia -change IVF and reduce -taking some PO water  HTN - holding BP meds  DM2 - Patient and family are unsure of her home insulin dosing Will put her on SSI Q4H moderate scale for now as she is NPO, may require upping to resistant scale as she does take 70 units of lantus daily.  Morbid obesity - BMI 83 Was very difficult to wean from vent post op from CABG in 2015, required trach and spending what looks like a month in the LTAC here weaning according to PCCM notes  OSA - BIPAP per home settings at bed time  Chronic respiratory failure -wears 2.5L O2 at home  Hyperkalemia -has hemolysis  DVT prophylaxis:  SCD's  Code Status: Full Code   Family Communication: Daughter at bedside  Disposition Plan:     Consultants:   GI  General surgery  cardiology  Procedures:        Subjective: Asking for ice chips  Objective: Filed Vitals:   12/04/15 1246 12/04/15 2008 12/04/15 2219 12/05/15 0424  BP: 154/81 147/58  155/65  Pulse: 73 73 81 77  Temp: 98.2 F (36.8 C) 98.9 F (37.2 C)  99.5 F (37.5 C)  TempSrc: Oral Oral  Oral  Resp: 18 18 18 19   Height:      Weight:      SpO2: 100% 100% 99% 99%    Intake/Output Summary (Last 24 hours) at 12/05/15 [id number] Last data filed  at 12/05/15 0942  Gross per 24 hour  Intake      0 ml  Output    900 ml  Net   -900 ml   Filed Weights   12/02/15 0452 12/03/15 1341  Weight: 109.7 kg (241 lb 13.5 oz) 109.317 kg (241 lb)    Examination:  General exam: Appears calm and comfortable -wears 2.5L O2 at home Respiratory system: diminished, no wheezing Cardiovascular system: S1 & S2 heard, RRR. No  JVD, murmurs, rubs, gallops or clicks. No pedal edema. Gastrointestinal system: tender, obese     Data Reviewed: I have personally reviewed following labs and imaging studies  CBC:  Recent Labs Lab 12/02/15 0923 12/03/15 0649 12/05/15 0247  WBC 20.9* 12.9* 13.5*  HGB 12.5 12.0 12.6  HCT 39.1 37.5 39.2  MCV 89.7 89.1 88.9  PLT 203 182 [id number]   Basic Metabolic Panel:  Recent Labs Lab 12/02/15 0923 12/03/15 0649 12/04/15 0340 12/05/15 0247  NA 145 144 143 148*  K 3.6 3.5 5.4* 3.7  CL 104 110 109 110  CO2 28 25 25 28   GLUCOSE 177* 104* 107* 119*  BUN 21* 24* 22* 13  CREATININE 2.63* 2.47* 1.98* 1.52*  CALCIUM 8.7* 8.4* 8.4* 9.0   GFR: Estimated Creatinine Clearance: 36.8 mL/min (by C-G formula based on Cr of 1.52). Liver Function Tests:  Recent Labs Lab 12/02/15 0923 12/03/15 0649 12/04/15 0340 12/05/15 0247  AST 117* 112* 117* 60*  ALT 55* 70* 64* 53  ALKPHOS 67 98 174* 206*  BILITOT 3.9* 3.5* 5.1* 3.0*  PROT 5.8* 5.7* 5.3* 5.6*  ALBUMIN 2.6* 2.4* 2.3* 2.1*    Recent Labs Lab 12/03/15 0649 12/04/15 0340  LIPASE 277* 91*   No results for input(s): AMMONIA in the last 168 hours. Coagulation Profile: No results for input(s): INR, PROTIME in the last 168 hours. Cardiac Enzymes: No results for input(s): CKTOTAL, CKMB, CKMBINDEX, TROPONINI in the last 168 hours. BNP (last 3 results) No results for input(s): PROBNP in the last 8760 hours. HbA1C: No results for input(s): HGBA1C in the last 72 hours. CBG:  Recent Labs Lab 12/04/15 1553 12/04/15 2003 12/04/15 2357 12/05/15 0419 12/05/15 0737  GLUCAP 118* 124* 116* 138* 117*   Lipid Profile: No results for input(s): CHOL, HDL, LDLCALC, TRIG, CHOLHDL, LDLDIRECT in the last 72 hours. Thyroid Function Tests: No results for input(s): TSH, T4TOTAL, FREET4, T3FREE, THYROIDAB in the last 72 hours. Anemia Panel: No results for input(s): VITAMINB12, FOLATE, FERRITIN, TIBC, IRON, RETICCTPCT in the last 72  hours. Urine analysis:    Component Value Date/Time   COLORURINE AMBER* 12/02/2015 1526   APPEARANCEUR TURBID* 12/02/2015 1526   LABSPEC 1.026 12/02/2015 1526   PHURINE 5.0 12/02/2015 1526   GLUCOSEU NEGATIVE 12/02/2015 1526   HGBUR NEGATIVE 12/02/2015 1526   BILIRUBINUR MODERATE* 12/02/2015 1526   KETONESUR 15* 12/02/2015 1526   PROTEINUR 30* 12/02/2015 1526   UROBILINOGEN 0.2 06/27/2013 0714   NITRITE NEGATIVE 12/02/2015 1526   LEUKOCYTESUR LARGE* 12/02/2015 1526     )No results found for this or any previous visit (from the past 240 hour(s)).    Anti-infectives    Start     Dose/Rate Route Frequency Ordered Stop   12/02/15 1400  piperacillin-tazobactam (ZOSYN) IVPB 3.375 g     3.375 g 12.5 mL/hr over 240 Minutes Intravenous Every 8 hours 12/02/15 0548     12/02/15 0600  Ampicillin-Sulbactam (UNASYN) 3 g in sodium chloride 0.9 % 100 mL IVPB  Status:  Discontinued  3 g 100 mL/hr over 60 Minutes Intravenous Every 8 hours 12/02/15 0514 12/02/15 0534   12/02/15 0600  piperacillin-tazobactam (ZOSYN) IVPB 3.375 g     3.375 g 100 mL/hr over 30 Minutes Intravenous STAT 12/02/15 0548 12/02/15 0640       Radiology Studies: Dg Chest 2 View  12/04/2015  CLINICAL DATA:  Preop cardiovascular exam 07/30/2013 EXAM: CHEST  2 VIEW COMPARISON:  None. FINDINGS: Internal jugular central line tip projects over the superior vena cava. Stable cardiac enlargement. There is vascular congestion with no definite pulmonary edema. Mild blunting left costophrenic angle may indicate small pleural effusion versus chronic pleural thickening. Study is limited by body habitus. IMPRESSION: Study limited by body habitus. Stable cardiac enlargement with vascular congestion. Mild left pleural thickening versus tiny left effusion. Electronically Signed   By: Skipper Cliche M.D.   On: 12/04/2015 17:09   Dg Ercp Biliary & Pancreatic Ducts  12/03/2015  CLINICAL DATA:  ERCP EXAM: ERCP TECHNIQUE: Multiple spot  images obtained with the fluoroscopic device and submitted for interpretation post-procedure. FLUOROSCOPY TIME:  Radiation Exposure Index (as provided by the fluoroscopic device): If the device does not provide the exposure index: Fluoroscopy Time:  3 minutes and 8 seconds Number of Acquired Images:  For images COMPARISON:  None. FINDINGS: Contrast fills the common bile duct. A balloon has been utilized to sweep the common bile duct. IMPRESSION: See above. These images were submitted for radiologic interpretation only. Please see the procedural report for the amount of contrast and the fluoroscopy time utilized. Electronically Signed   By: Marybelle Killings M.D.   On: 12/03/2015 16:48        Scheduled Meds: . amiodarone  200 mg Oral Daily  . antiseptic oral rinse  7 mL Mouth Rinse BID  . budesonide (PULMICORT) nebulizer solution  0.5 mg Nebulization Daily  . insulin aspart  0-15 Units Subcutaneous Q4H  . montelukast  10 mg Oral QHS  . pantoprazole  40 mg Oral Daily  . PARoxetine  40 mg Oral Daily  . piperacillin-tazobactam (ZOSYN)  IV  3.375 g Intravenous Q8H   Continuous Infusions: . sodium chloride 75 mL/hr at 12/05/15 0943     LOS: 3 days    Time spent: 25 min    [place], DO [facility] Pager [phone]  If 7PM-7AM, please contact night-coverage [email or web address] Password [facility] 12/05/2015, 9:48 AM

## 2015-12-05 NOTE — Progress Notes (Signed)
Patient placed on CPAP Qhs auto mode and doing well. #l bleed in of O2 and patient tolerating.

## 2015-12-05 NOTE — Progress Notes (Signed)
2 Days Post-Op  Subjective: Pt feels much better today  Objective: Vital signs in last 24 hours: Temp:  [98.2 F (36.8 C)-99.5 F (37.5 C)] 99.5 F (37.5 C) (06/18 0424) Pulse Rate:  [73-81] 77 (06/18 0424) Resp:  [18-19] 19 (06/18 0424) BP: (147-155)/(58-81) 155/65 mmHg (06/18 0424) SpO2:  [99 %-100 %] 99 % (06/18 0424) Last BM Date: 12/01/15  Intake/Output from previous day: 06/17 0701 - 06/18 0700 In: 0  Out: 900 [Urine:900] Intake/Output this shift:    General appearance: alert and cooperative GI: soft, non-tender; bowel sounds normal; no masses,  no organomegaly  Lab Results:   Recent Labs  12/03/15 0649 12/05/15 0247  WBC 12.9* 13.5*  HGB 12.0 12.6  HCT 37.5 39.2  PLT 182 182   BMET  Recent Labs  12/04/15 0340 12/05/15 0247  NA 143 148*  K 5.4* 3.7  CL 109 110  CO2 25 28  GLUCOSE 107* 119*  BUN 22* 13  CREATININE 1.98* 1.52*  CALCIUM 8.4* 9.0   PT/INR No results for input(s): LABPROT, INR in the last 72 hours. ABG No results for input(s): PHART, HCO3 in the last 72 hours.  Invalid input(s): PCO2, PO2  Studies/Results: Dg Chest 2 View  12/04/2015  CLINICAL DATA:  Preop cardiovascular exam 07/30/2013 EXAM: CHEST  2 VIEW COMPARISON:  None. FINDINGS: Internal jugular central line tip projects over the superior vena cava. Stable cardiac enlargement. There is vascular congestion with no definite pulmonary edema. Mild blunting left costophrenic angle may indicate small pleural effusion versus chronic pleural thickening. Study is limited by body habitus. IMPRESSION: Study limited by body habitus. Stable cardiac enlargement with vascular congestion. Mild left pleural thickening versus tiny left effusion. Electronically Signed   By: Skipper Cliche M.D.   On: 12/04/2015 17:09   Dg Ercp Biliary & Pancreatic Ducts  12/03/2015  CLINICAL DATA:  ERCP EXAM: ERCP TECHNIQUE: Multiple spot images obtained with the fluoroscopic device and submitted for interpretation  post-procedure. FLUOROSCOPY TIME:  Radiation Exposure Index (as provided by the fluoroscopic device): If the device does not provide the exposure index: Fluoroscopy Time:  3 minutes and 8 seconds Number of Acquired Images:  For images COMPARISON:  None. FINDINGS: Contrast fills the common bile duct. A balloon has been utilized to sweep the common bile duct. IMPRESSION: See above. These images were submitted for radiologic interpretation only. Please see the procedural report for the amount of contrast and the fluoroscopy time utilized. Electronically Signed   By: Marybelle Killings M.D.   On: 12/03/2015 16:48    Anti-infectives: Anti-infectives    Start     Dose/Rate Route Frequency Ordered Stop   12/02/15 1400  piperacillin-tazobactam (ZOSYN) IVPB 3.375 g     3.375 g 12.5 mL/hr over 240 Minutes Intravenous Every 8 hours 12/02/15 0548     12/02/15 0600  Ampicillin-Sulbactam (UNASYN) 3 g in sodium chloride 0.9 % 100 mL IVPB  Status:  Discontinued     3 g 100 mL/hr over 60 Minutes Intravenous Every 8 hours 12/02/15 0514 12/02/15 0534   12/02/15 0600  piperacillin-tazobactam (ZOSYN) IVPB 3.375 g     3.375 g 100 mL/hr over 30 Minutes Intravenous STAT 12/02/15 0548 12/02/15 0640      Assessment/Plan: Choledocholithiasis, pancreatitis, cholelithiasis, s/p ERCP Card con't w/u  With Echo Pt appears high risk for surgery.  Pt did have sphincterotomy from ERCP.  Pt may need medical tx only due to high risk status. Will follow    LOS: 3 days  Rosario Jacks., Anne Hahn 12/05/2015

## 2015-12-05 NOTE — Progress Notes (Signed)
Pharmacy Antibiotic Note  Lori Burnett is a 80 y.o. female admitted on 12/02/2015 with intra-abd infection. Transferred from [facility] for ERCP. Pharmacy consulted for Zosyn dosing.  Assessment: She has tolerated IV antibiotics without note of adverse reactions.  She continues to have improved renal function with creatinine down to 1.5 today and an estimated crcl of 75ml/min.  Her WBC is 13.5K slightly up.  She remains afebrile.  No cultures obtained thus far.  Plan: Zosyn 3.375gm IV now over 30 min then 3.375gm IV q8h - subsequent doses over 4 hours Monitor renal function and plans for length of therapy with IV antibiotics.  Height: 5\' 3"  (160 cm) Weight: 241 lb (109.317 kg) IBW/kg (Calculated) : 52.4  Temp (24hrs), Avg:99.2 F (37.3 C), Min:98.9 F (37.2 C), Max:99.5 F (37.5 C)   Recent Labs Lab 12/02/15 0923 12/03/15 0649 12/04/15 0340 12/05/15 0247  WBC 20.9* 12.9*  --  13.5*  CREATININE 2.63* 2.47* 1.98* 1.52*    Estimated Creatinine Clearance: 36.8 mL/min (by C-G formula based on Cr of 1.52).    No Known Allergies  Antimicrobials this admission: 6/15 Zosyn >>   Rober Minion, PharmD., MS Clinical Pharmacist Pager:  [phone] Thank you for allowing pharmacy to be part of this patients care team. 12/05/2015 2:07 PM

## 2015-12-05 NOTE — Progress Notes (Signed)
   SUBJECTIVE: The patient is doing well today.  At this time, she denies chest pain, shortness of breath, or any new concerns.  Marland Kitchen amiodarone  200 mg Oral Daily  . antiseptic oral rinse  7 mL Mouth Rinse BID  . budesonide (PULMICORT) nebulizer solution  0.5 mg Nebulization Daily  . insulin aspart  0-15 Units Subcutaneous Q4H  . montelukast  10 mg Oral QHS  . pantoprazole  40 mg Oral Daily  . PARoxetine  40 mg Oral Daily  . piperacillin-tazobactam (ZOSYN)  IV  3.375 g Intravenous Q8H   . sodium chloride 75 mL/hr at 12/05/15 0943    OBJECTIVE: Physical Exam: Filed Vitals:   12/04/15 1246 12/04/15 2008 12/04/15 2219 12/05/15 0424  BP: 154/81 147/58  155/65  Pulse: 73 73 81 77  Temp: 98.2 F (36.8 C) 98.9 F (37.2 C)  99.5 F (37.5 C)  TempSrc: Oral Oral  Oral  Resp: 18 18 18 19   Height:      Weight:      SpO2: 100% 100% 99% 99%    Intake/Output Summary (Last 24 hours) at 12/05/15 1206 Last data filed at 12/05/15 [id number]  Gross per 24 hour  Intake      0 ml  Output    400 ml  Net   -400 ml    GEN- The patient is overweight and chronically ill appearing,  Sleeping in chair but rouses Head- normocephalic, atraumatic Eyes-  Sclera clear, conjunctiva pink Ears- hearing intact Oropharynx- clear Neck- supple,   Lungs- decreased BS at bases,  normal work of breathing Heart- Regular rate and rhythm  GI- soft, NT, ND, + BS Extremities- no clubbing, cyanosis, + dependant edema Skin- no rash or lesion Psych- euthymic mood, full affect Neuro- strength and sensation are intact  LABS: Basic Metabolic Panel:  Recent Labs  12/04/15 0340 12/05/15 0247  NA 143 148*  K 5.4* 3.7  CL 109 110  CO2 25 28  GLUCOSE 107* 119*  BUN 22* 13  CREATININE 1.98* 1.52*  CALCIUM 8.4* 9.0   Liver Function Tests:  Recent Labs  12/04/15 0340 12/05/15 0247  AST 117* 60*  ALT 64* 53  ALKPHOS 174* 206*  BILITOT 5.1* 3.0*  PROT 5.3* 5.6*  ALBUMIN 2.3* 2.1*    Recent Labs  12/03/15 0649 12/04/15 0340  LIPASE 277* 91*   CBC:  Recent Labs  12/03/15 0649 12/05/15 0247  WBC 12.9* 13.5*  HGB 12.0 12.6  HCT 37.5 39.2  MCV 89.1 88.9  PLT 182 182    ekg reveals sinus rhythm with LAD, poor R wave progression  ASSESSMENT AND PLAN:   1. CAD The patient has a h/o CAD with prior CABG. She is very limited in her activity. Denies chest pain. Has chronic lung disease with SOB and home O2 requirement. Further CV history is unavailable.   She is not currently on CV medications. I suspect beta blocker may be limited by lung disease and Ace inhibitor by renal disease. She is on amiodarone for unknown reasons. We will need to obtain records from Dr Richrd Sox office tomorrow. Echo is pending  2. preop The patient is at high risk for any procedures given chronic lung disease on home O2, morbid obesity, renal failure, etc. I doubt that we can reduce her surgical risks overall. Will obtain echo, and records from primary cardiologist in [facility] cardiology to follow  Thompson Grayer, MD 12/05/2015 12:06 PM

## 2015-12-06 ENCOUNTER — Other Ambulatory Visit (HOSPITAL_COMMUNITY): Payer: Medicare Other

## 2015-12-06 ENCOUNTER — Inpatient Hospital Stay (HOSPITAL_COMMUNITY): Payer: Medicare Other

## 2015-12-06 ENCOUNTER — Encounter (HOSPITAL_COMMUNITY): Payer: Self-pay | Admitting: Gastroenterology

## 2015-12-06 DIAGNOSIS — R7989 Other specified abnormal findings of blood chemistry: Secondary | ICD-10-CM

## 2015-12-06 DIAGNOSIS — R945 Abnormal results of liver function studies: Secondary | ICD-10-CM

## 2015-12-06 DIAGNOSIS — I517 Cardiomegaly: Secondary | ICD-10-CM

## 2015-12-06 LAB — ECHOCARDIOGRAM COMPLETE
CHL CUP MV DEC (S): 229
CHL CUP REG VEL DIAS: 129 cm/s
CHL CUP TV REG PEAK VELOCITY: 267 cm/s
E/e' ratio: 14.24
EWDT: 229 ms
FS: 43 % (ref 28–44)
Height: 63 in
IVS/LV PW RATIO, ED: 0.87
LA ID, A-P, ES: 42 mm
LADIAMINDEX: 1.85 cm/m2
LAVOLA4C: 73.5 mL
LEFT ATRIUM END SYS DIAM: 42 mm
LV PW d: 15 mm — AB (ref 0.6–1.1)
LV TDI E'LATERAL: 6.2
LV TDI E'MEDIAL: 6.31
LV e' LATERAL: 6.2 cm/s
LVEEAVG: 14.24
LVEEMED: 14.24
LVOT area: 3.14 cm2
LVOTD: 20 mm
MV Peak grad: 3 mmHg
MV pk E vel: 88.3 m/s
MVPKAVEL: 95.6 m/s
PV Reg grad dias: 7 mmHg
TRMAXVEL: 267 cm/s
Weight: 3856 oz

## 2015-12-06 LAB — GLUCOSE, CAPILLARY
GLUCOSE-CAPILLARY: 119 mg/dL — AB (ref 65–99)
GLUCOSE-CAPILLARY: 115 mg/dL — AB (ref 65–99)
GLUCOSE-CAPILLARY: 191 mg/dL — AB (ref 65–99)
GLUCOSE-CAPILLARY: 170 mg/dL — AB (ref 65–99)
Glucose-Capillary: 121 mg/dL — ABNORMAL HIGH (ref 65–99)
Glucose-Capillary: 119 mg/dL — ABNORMAL HIGH (ref 65–99)

## 2015-12-06 LAB — CBC
HCT: 35.3 % — ABNORMAL LOW (ref 36.0–46.0)
HEMOGLOBIN: 11.2 g/dL — AB (ref 12.0–15.0)
MCH: 28.6 pg (ref 26.0–34.0)
MCHC: 31.7 g/dL (ref 30.0–36.0)
MCV: 90.1 fL (ref 78.0–100.0)
PLATELETS: 164 10*3/uL (ref 150–400)
RBC: 3.92 MIL/uL (ref 3.87–5.11)
RDW: 15.6 % — ABNORMAL HIGH (ref 11.5–15.5)
WBC: 10.8 10*3/uL — AB (ref 4.0–10.5)

## 2015-12-06 NOTE — Evaluation (Signed)
Physical Therapy Evaluation Patient Details Name: Lori Burnett MRN: [id number] DOB: 04-11-1936 Today's Date: 12/06/2015   History of Present Illness  pt presents with Cholecystitis with Obstruction, Pancreatits, and Gall Stones.  pt with hx of CAD, CABG with trach and prolonged vent, DM, Chronic Lung Disease, Colon CA, Breast CA s/p L Mastectomy, CKD, COPD on Home O2, HTN, CHF, Pulmonary HTN, PAD, Chronic Bronchitis, OSA, Sickle Cell Trait, Anxiety, and Depression.    Clinical Impression  Pt generally weak and debilitated, but suspect this is not far from baseline for pt.  Pt does admit to needing to get stronger and do more for herself.  Pt states she uses 2 1/2 to 3L O2 at home and noted to be on 2L O2 while here.  Feel pt would benefit from HHPT at D/C for home safety and continuing mobility.  Will continue to follow.      Follow Up Recommendations Home health PT;Supervision - Intermittent    Equipment Recommendations   [phone] pending progress)    Recommendations for Other Services       Precautions / Restrictions Precautions Precautions: Fall Precaution Comments: Watch O2 Restrictions Weight Bearing Restrictions: No      Mobility  Bed Mobility               General bed mobility comments: pt sitting EOB  Transfers Overall transfer level: Needs assistance Equipment used: None Transfers: Sit to/from Stand Sit to Stand: Supervision         General transfer comment: pt with increased effort to come to stand, but no physical A needed.  Definite use of UEs.    Ambulation/Gait Ambulation/Gait assistance: Min guard Ambulation Distance (Feet): 10 Feet Assistive device: None Gait Pattern/deviations: Step-through pattern;Decreased stride length     General Gait Details: pt only agreeable to ambulate next to bed in room due to overall fatigue and weak feeling.  pt with moderate lateral sway due to body habitus and increased WOB, but pt indicates this is normal for her.     Stairs            Wheelchair Mobility    Modified Rankin (Stroke Patients Only)       Balance Overall balance assessment: Needs assistance Sitting-balance support: No upper extremity supported;Feet supported Sitting balance-Leahy Scale: Good     Standing balance support: No upper extremity supported;During functional activity Standing balance-Leahy Scale: Good                               Pertinent Vitals/Pain Pain Assessment: No/denies pain    Home Living Family/patient expects to be discharged to:: Private residence Living Arrangements: Children Available Help at Discharge: Family;Available PRN/intermittently (Family works) Type of Home: House Home Access: Ramped entrance     Home Layout: Multi-level (split level) Home Equipment: Toilet riser      Prior Function Level of Independence: Needs assistance   Gait / Transfers Assistance Needed: Only ambulates short distances due to SOB.  ADL's / Homemaking Assistance Needed: Daughter A with ADLs and homemaking tasks.        Hand Dominance        Extremity/Trunk Assessment   Upper Extremity Assessment: Generalized weakness           Lower Extremity Assessment: Generalized weakness      Cervical / Trunk Assessment: Normal  Communication   Communication: No difficulties  Cognition Arousal/Alertness: Awake/alert Behavior During Therapy: WFL for tasks assessed/performed Overall  Cognitive Status: Within Functional Limits for tasks assessed                      General Comments      Exercises        Assessment/Plan    PT Assessment Patient needs continued PT services  PT Diagnosis Difficulty walking;Generalized weakness   PT Problem List Decreased strength;Decreased activity tolerance;Decreased balance;Decreased mobility;Decreased knowledge of use of DME;Cardiopulmonary status limiting activity;Obesity  PT Treatment Interventions DME instruction;Gait training;Stair  training;Functional mobility training;Therapeutic activities;Therapeutic exercise;Balance training;Patient/family education   PT Goals (Current goals can be found in the Care Plan section) Acute Rehab PT Goals Patient Stated Goal: "I need to get my strength back." PT Goal Formulation: With patient Time For Goal Achievement: 12/20/15 Potential to Achieve Goals: Good    Frequency Min 3X/week   Barriers to discharge Decreased caregiver support Family work during the day    Co-evaluation               End of Session Equipment Utilized During Treatment: Oxygen Activity Tolerance: Patient limited by fatigue Patient left: in chair;with call bell/phone within reach Nurse Communication: Mobility status         Time: [id number] PT Time Calculation (min) (ACUTE ONLY): 19 min   Charges:   PT Evaluation $PT Eval Moderate Complexity: 1 Procedure     PT G CodesCatarina Burnett, [place] 12/06/2015, 1:39 PM

## 2015-12-06 NOTE — Care Management Note (Addendum)
Case Management Note  Patient Details  Name: Lori Burnett MRN: [id number] Date of Birth: 1935-07-16  Subjective/Objective:                    Action/Plan:  Discussed home health with patient confirmed face sheet information. Patient states she lives with her daughter who can provide supervision.    She already has home oxygen through [facility] [place] . Patient states her family will transport her home on day of discharge and she has a portable oxygen tank available. Patient already has 3 in 1 at home , needs walker same ordered.  Patient would like same home health agency as she had before .  List of choice provided to patient . Patient thought she had home health through [facility] called same they do not cover [place] . Patient aware relooked at list thought it was [facility] called same no record of patient . [facility] was at [facility] then went to [facility] in [place] called same , was told it was not documented who the home health agency was. Called patient's daughter no answer and no voice mail.  Called [facility] home health no record.  [place] also does not have a record of patient receiving home health through them , but she does receive her home oxygen through them [phone]   Patient aware of above and will discuss home health with her daughter tonight.   Will follow up in morning. Expected Discharge Date:                  Expected Discharge Plan:  [place]  In-House Referral:     Discharge planning Services  CM Consult  Post Acute Care Choice:  Durable Medical Equipment, Home Health Choice offered to:  Patient  DME Arranged:  Walker rolling DME Agency:  [place]:    [facility] Agency:     Status of Service:  In process, will continue to follow  Medicare Important Message Given:  Yes Date Medicare IM Given:    Medicare IM give by:    Date Additional Medicare IM Given:     Additional Medicare Important Message give by:     If discussed at [place] of Stay Meetings, dates discussed:    Additional Comments:  Marilu Favre, RN 12/06/2015, 3:42 PM

## 2015-12-06 NOTE — Care Management Important Message (Signed)
Important Message  Patient Details  Name: Lori Burnett MRN: [id number] Date of Birth: [birth year]   Medicare Important Message Given:  Yes    Loann Quill 12/06/2015, 10:32 AM

## 2015-12-06 NOTE — Progress Notes (Signed)
Patient ID: Lori Burnett, female   DOB: 11-03-1935, 80 y.o.   MRN: [id number]     [address] [place], [place] [address]    Phone: [phone] FAX: [phone]     Subjective: No pain. Npo x4 days.   Objective:  Vital signs:  Filed Vitals:   12/05/15 1424 12/05/15 2144 12/06/15 0522 12/06/15 0810  BP: 161/71 144/62 160/69   Pulse: 74 87 79   Temp: 98.3 F (36.8 C) 97.7 F (36.5 C) 98.9 F (37.2 C)   TempSrc: Oral Oral Oral   Resp: 18     Height:      Weight:      SpO2: 100% 93% 98% 94%    Last BM Date: 12/01/15  Intake/Output   Yesterday:  06/18 0701 - 06/19 0700 In: 932 [I.V.:932] Out: -  This shift:   Physical Exam: General: Pt awake/alert/oriented x4 in no acute distress Abdomen: Soft.  Nondistended.  ttp ruq. Marland Kitchen  No evidence of peritonitis.  No incarcerated hernias. Ext:  SCDs BLE.  No mjr edema.  No cyanosis Skin: No petechiae / purpura   Problem List:   Principal Problem:   Choledocholithiasis with acute cholecystitis with obstruction Active Problems:   OSA (obstructive sleep apnea)   Morbid obesity (HCC)   DM2 (diabetes mellitus, type 2) (HCC)   HTN (hypertension)   Leukocytosis   AKI (acute kidney injury) ([place])   Acute gallstone pancreatitis   Abdominal pain   Preop cardiovascular exam   Coronary artery disease involving coronary bypass graft of native heart without angina pectoris    Results:   Labs: Results for orders placed or performed during the hospital encounter of 12/02/15 (from the past 48 hour(s))  Glucose, capillary     Status: Abnormal   Collection Time: 12/04/15 11:59 AM  Result Value Ref Range   Glucose-Capillary 107 (H) 65 - 99 mg/dL  Glucose, capillary     Status: Abnormal   Collection Time: 12/04/15  3:53 PM  Result Value Ref Range   Glucose-Capillary 118 (H) 65 - 99 mg/dL  Glucose, capillary     Status: Abnormal   Collection Time: 12/04/15  8:03 PM   Result Value Ref Range   Glucose-Capillary 124 (H) 65 - 99 mg/dL  Glucose, capillary     Status: Abnormal   Collection Time: 12/04/15 11:57 PM  Result Value Ref Range   Glucose-Capillary 116 (H) 65 - 99 mg/dL  CBC     Status: Abnormal   Collection Time: 12/05/15  2:47 AM  Result Value Ref Range   WBC 13.5 (H) 4.0 - 10.5 K/uL   RBC 4.41 3.87 - 5.11 MIL/uL   Hemoglobin 12.6 12.0 - 15.0 g/dL   HCT 39.2 36.0 - 46.0 %   MCV 88.9 78.0 - 100.0 fL   MCH 28.6 26.0 - 34.0 pg   MCHC 32.1 30.0 - 36.0 g/dL   RDW 15.5 11.5 - 15.5 %   Platelets 182 150 - 400 K/uL  Comprehensive metabolic panel     Status: Abnormal   Collection Time: 12/05/15  2:47 AM  Result Value Ref Range   Sodium 148 (H) 135 - 145 mmol/L   Potassium 3.7 3.5 - 5.1 mmol/L    Comment: DELTA CHECK NOTED   Chloride 110 101 - 111 mmol/L   CO2 28 22 - 32 mmol/L   Glucose, Bld 119 (H) 65 - 99 mg/dL   BUN  13 6 - 20 mg/dL   Creatinine, Ser 1.52 (H) 0.44 - 1.00 mg/dL   Calcium 9.0 8.9 - 10.3 mg/dL   Total Protein 5.6 (L) 6.5 - 8.1 g/dL   Albumin 2.1 (L) 3.5 - 5.0 g/dL   AST 60 (H) 15 - 41 U/L   ALT 53 14 - 54 U/L   Alkaline Phosphatase 206 (H) 38 - 126 U/L   Total Bilirubin 3.0 (H) 0.3 - 1.2 mg/dL   GFR calc non Af Amer 32 (L) >60 mL/min   GFR calc Af Amer 37 (L) >60 mL/min    Comment: (NOTE) The eGFR has been calculated using the CKD EPI equation. This calculation has not been validated in all clinical situations. eGFR's persistently <60 mL/min signify possible Chronic Kidney Disease.    Anion gap 10 5 - 15  Glucose, capillary     Status: Abnormal   Collection Time: 12/05/15  4:19 AM  Result Value Ref Range   Glucose-Capillary 138 (H) 65 - 99 mg/dL  Glucose, capillary     Status: Abnormal   Collection Time: 12/05/15  7:37 AM  Result Value Ref Range   Glucose-Capillary 117 (H) 65 - 99 mg/dL  Glucose, capillary     Status: Abnormal   Collection Time: 12/05/15 12:03 PM  Result Value Ref Range   Glucose-Capillary 117  (H) 65 - 99 mg/dL  Glucose, capillary     Status: Abnormal   Collection Time: 12/05/15  4:17 PM  Result Value Ref Range   Glucose-Capillary 124 (H) 65 - 99 mg/dL  Glucose, capillary     Status: Abnormal   Collection Time: 12/05/15  8:26 PM  Result Value Ref Range   Glucose-Capillary 130 (H) 65 - 99 mg/dL  Glucose, capillary     Status: Abnormal   Collection Time: 12/06/15 12:07 AM  Result Value Ref Range   Glucose-Capillary 121 (H) 65 - 99 mg/dL  Glucose, capillary     Status: Abnormal   Collection Time: 12/06/15  5:27 AM  Result Value Ref Range   Glucose-Capillary 119 (H) 65 - 99 mg/dL    Imaging / Studies: Dg Chest 2 View  12/04/2015  CLINICAL DATA:  Preop cardiovascular exam 07/30/2013 EXAM: CHEST  2 VIEW COMPARISON:  None. FINDINGS: Internal jugular central line tip projects over the superior vena cava. Stable cardiac enlargement. There is vascular congestion with no definite pulmonary edema. Mild blunting left costophrenic angle may indicate small pleural effusion versus chronic pleural thickening. Study is limited by body habitus. IMPRESSION: Study limited by body habitus. Stable cardiac enlargement with vascular congestion. Mild left pleural thickening versus tiny left effusion. Electronically Signed   By: Skipper Cliche M.D.   On: 12/04/2015 17:09    Medications / Allergies:  Scheduled Meds: . amiodarone  200 mg Oral Daily  . antiseptic oral rinse  7 mL Mouth Rinse BID  . budesonide (PULMICORT) nebulizer solution  0.5 mg Nebulization Daily  . insulin aspart  0-15 Units Subcutaneous Q4H  . montelukast  10 mg Oral QHS  . pantoprazole  40 mg Oral Daily  . PARoxetine  40 mg Oral Daily  . piperacillin-tazobactam (ZOSYN)  IV  3.375 g Intravenous Q8H   Continuous Infusions: . sodium chloride 75 mL/hr at 12/05/15 0943   PRN Meds:.morphine injection, ondansetron (ZOFRAN) IV  Antibiotics: Anti-infectives    Start     Dose/Rate Route Frequency Ordered Stop   12/02/15 1400   piperacillin-tazobactam (ZOSYN) IVPB 3.375 g     3.375 g  12.5 mL/hr over 240 Minutes Intravenous Every 8 hours 12/02/15 0548     12/02/15 0600  Ampicillin-Sulbactam (UNASYN) 3 g in sodium chloride 0.9 % 100 mL IVPB  Status:  Discontinued     3 g 100 mL/hr over 60 Minutes Intravenous Every 8 hours 12/02/15 0514 12/02/15 0534   12/02/15 0600  piperacillin-tazobactam (ZOSYN) IVPB 3.375 g     3.375 g 100 mL/hr over 30 Minutes Intravenous STAT 12/02/15 0548 12/02/15 0640        Assessment/Plan Choledocholithiasis, pancreatitis, cholelithiasis (possible cholecystitis on CT) S/p ERCP sphincterotomy   Echo pending.  Pt is a high risk surgical candidate.  Given cardiopulmonary and functional status, may be in patients best interest to treat non operatively.  Can advance diet.  Will await cards final recs.    Erby Pian, [facility] Pager [phone]) For consults and floor pages call [phone](7A-4:30P)  12/06/2015 9:02 AM

## 2015-12-06 NOTE — Progress Notes (Signed)
I offered pt. A bath but pt. Stated she received a bath last night and she does not need one today

## 2015-12-06 NOTE — Progress Notes (Signed)
PROGRESS NOTE    Lori Burnett  [id number] DOB: Jul 24, 1935 DOA: 12/02/2015 PCP: Normand Sloop, MD   Outpatient Specialists:     Brief Narrative:  Lori Burnett is a 80 y.o. female with medical history significant of DM, HTN, HVOS, CAD. Patient presents to the ED at [facility] with c/o N/V, abdominal pain radiating to both flanks and back. Symptoms onset yesterday morning. Have worsened earlier in the evening which prompted her to go to [facility].  ED Course: Work up in the ED at [facility] demonstrated acute pancreatitis with lipase > 30k, CT scan showed "mild acute pancreatitis", US gallbladder showed distended bile duct, cholecystitis, cholelithiasis, and clinical picture that was suspicious for choledocholithiasis. She also has AKI with creatinine of 2.1, leukocytosis with WBC of 20k. Patient given 1L bolus IVF and Zosyn. Due to patient likely needing MRCP / ERCP, she was transferred to [facility].    Assessment & Plan:   Principal Problem:   Choledocholithiasis with acute cholecystitis with obstruction Active Problems:   OSA (obstructive sleep apnea)   Morbid obesity (HCC)   DM2 (diabetes mellitus, type 2) (HCC)   HTN (hypertension)   Leukocytosis   AKI (acute kidney injury) ([place])   Acute gallstone pancreatitis   Abdominal pain   Preop cardiovascular exam   Coronary artery disease involving coronary bypass graft of native heart without angina pectoris   Choledocholithiasis, with acute cholecystitis, obstruction, and acute gallstone pancreatitis - Zosyn ERCP as patient unable to fit in MRCP machine- done 6/16 Purulent cholangitis, small flecks of stonedebris were removed from  the bile duct after biliary sphinctertomy and balloon sweeping. Morphine PRN pain Zofran PRN nausea per general surgery: proceed with cholecystectomy once pancreatitis resolves vs conservative management--  Consulted card-- echo order-- patient most  likely will be high risk from both pulm and cardiology standpoint-- had CABG in 2014 and was not able to be extubated and spent > 1 month in [facility] with trach  AKI - likely pre-renal in setting of pancreatitis and borderline low BPs. Strict intake and output Hold diuretics and ACEi Not entirely clear on baseline but it sounds like baseline creatinine may be 1.9 per family  Hypernatremia -change IVF and reduce -taking some PO water  HTN - holding BP meds  DM2 - Patient and family are unsure of her home insulin dosing Will put her on SSI Q4H moderate scale for now as she is NPO, may require upping to resistant scale as she does take 70 units of lantus daily.  Morbid obesity - BMI 74 Was very difficult to wean from vent post op from CABG in 2015, required trach and spending what looks like a month in the LTAC here weaning according to PCCM notes  OSA - BIPAP per home settings at bed time  Chronic respiratory failure -wears 2.5L O2 at home  Hyperkalemia -resolved   DVT prophylaxis:  SCD's  Code Status: Full Code   Family Communication: Daughter at bedside  Disposition Plan:     Consultants:   GI  General surgery  cardiology  Procedures:        Subjective: Pain improved  Objective: Filed Vitals:   12/05/15 1424 12/05/15 2144 12/06/15 0522 12/06/15 0810  BP: 161/71 144/62 160/69   Pulse: 74 87 79   Temp: 98.3 F (36.8 C) 97.7 F (36.5 C) 98.9 F (37.2 C)   TempSrc: Oral Oral Oral   Resp: 18     Height:      Weight:      SpO2:  100% 93% 98% 94%    Intake/Output Summary (Last 24 hours) at 12/06/15 1427 Last data filed at 12/06/15 1252  Gross per 24 hour  Intake   1052 ml  Output    850 ml  Net    202 ml   Filed Weights   12/02/15 0452 12/03/15 1341  Weight: 109.7 kg (241 lb 13.5 oz) 109.317 kg (241 lb)    Examination:  General exam: Appears calm and comfortable  -wears 2.5L O2 at home Respiratory system: diminished, no wheezing Cardiovascular system: S1 & S2 heard, RRR. No JVD, murmurs, rubs, gallops or clicks. No pedal edema. Gastrointestinal system: tender, obese     Data Reviewed: I have personally reviewed following labs and imaging studies  CBC:  Recent Labs Lab 12/02/15 0923 12/03/15 0649 12/05/15 0247 12/06/15 1032  WBC 20.9* 12.9* 13.5* 10.8*  HGB 12.5 12.0 12.6 11.2*  HCT 39.1 37.5 39.2 35.3*  MCV 89.7 89.1 88.9 90.1  PLT 203 182 182 [id number]   Basic Metabolic Panel:  Recent Labs Lab 12/02/15 0923 12/03/15 0649 12/04/15 0340 12/05/15 0247  NA 145 144 143 148*  K 3.6 3.5 5.4* 3.7  CL 104 110 109 110  CO2 28 25 25 28   GLUCOSE 177* 104* 107* 119*  BUN 21* 24* 22* 13  CREATININE 2.63* 2.47* 1.98* 1.52*  CALCIUM 8.7* 8.4* 8.4* 9.0   GFR: Estimated Creatinine Clearance: 36.8 mL/min (by C-G formula based on Cr of 1.52). Liver Function Tests:  Recent Labs Lab 12/02/15 0923 12/03/15 0649 12/04/15 0340 12/05/15 0247  AST 117* 112* 117* 60*  ALT 55* 70* 64* 53  ALKPHOS 67 98 174* 206*  BILITOT 3.9* 3.5* 5.1* 3.0*  PROT 5.8* 5.7* 5.3* 5.6*  ALBUMIN 2.6* 2.4* 2.3* 2.1*    Recent Labs Lab 12/03/15 0649 12/04/15 0340  LIPASE 277* 91*   No results for input(s): AMMONIA in the last 168 hours. Coagulation Profile: No results for input(s): INR, PROTIME in the last 168 hours. Cardiac Enzymes: No results for input(s): CKTOTAL, CKMB, CKMBINDEX, TROPONINI in the last 168 hours. BNP (last 3 results) No results for input(s): PROBNP in the last 8760 hours. HbA1C: No results for input(s): HGBA1C in the last 72 hours. CBG:  Recent Labs Lab 12/05/15 2026 12/06/15 0007 12/06/15 0527 12/06/15 0738 12/06/15 1222  GLUCAP 130* 121* 119* 119* 115*   Lipid Profile: No results for input(s): CHOL, HDL, LDLCALC, TRIG, CHOLHDL, LDLDIRECT in the last 72 hours. Thyroid Function Tests: No results for input(s): TSH, T4TOTAL,  FREET4, T3FREE, THYROIDAB in the last 72 hours. Anemia Panel: No results for input(s): VITAMINB12, FOLATE, FERRITIN, TIBC, IRON, RETICCTPCT in the last 72 hours. Urine analysis:    Component Value Date/Time   COLORURINE AMBER* 12/02/2015 1526   APPEARANCEUR TURBID* 12/02/2015 1526   LABSPEC 1.026 12/02/2015 1526   PHURINE 5.0 12/02/2015 1526   GLUCOSEU NEGATIVE 12/02/2015 1526   HGBUR NEGATIVE 12/02/2015 1526   BILIRUBINUR MODERATE* 12/02/2015 1526   KETONESUR 15* 12/02/2015 1526   PROTEINUR 30* 12/02/2015 1526   UROBILINOGEN 0.2 06/27/2013 0714   NITRITE NEGATIVE 12/02/2015 1526   LEUKOCYTESUR LARGE* 12/02/2015 1526     )No results found for this or any previous visit (from the past 240 hour(s)).    Anti-infectives    Start     Dose/Rate Route Frequency Ordered Stop   12/02/15 1400  piperacillin-tazobactam (ZOSYN) IVPB 3.375 g     3.375 g 12.5 mL/hr over 240 Minutes Intravenous Every 8 hours 12/02/15 0548  12/02/15 0600  Ampicillin-Sulbactam (UNASYN) 3 g in sodium chloride 0.9 % 100 mL IVPB  Status:  Discontinued     3 g 100 mL/hr over 60 Minutes Intravenous Every 8 hours 12/02/15 0514 12/02/15 0534   12/02/15 0600  piperacillin-tazobactam (ZOSYN) IVPB 3.375 g     3.375 g 100 mL/hr over 30 Minutes Intravenous STAT 12/02/15 0548 12/02/15 0640       Radiology Studies: Dg Chest 2 View  12/04/2015  CLINICAL DATA:  Preop cardiovascular exam 07/30/2013 EXAM: CHEST  2 VIEW COMPARISON:  None. FINDINGS: Internal jugular central line tip projects over the superior vena cava. Stable cardiac enlargement. There is vascular congestion with no definite pulmonary edema. Mild blunting left costophrenic angle may indicate small pleural effusion versus chronic pleural thickening. Study is limited by body habitus. IMPRESSION: Study limited by body habitus. Stable cardiac enlargement with vascular congestion. Mild left pleural thickening versus tiny left effusion. Electronically Signed   By:  Skipper Cliche M.D.   On: 12/04/2015 17:09        Scheduled Meds: . amiodarone  200 mg Oral Daily  . antiseptic oral rinse  7 mL Mouth Rinse BID  . budesonide (PULMICORT) nebulizer solution  0.5 mg Nebulization Daily  . insulin aspart  0-15 Units Subcutaneous Q4H  . montelukast  10 mg Oral QHS  . pantoprazole  40 mg Oral Daily  . PARoxetine  40 mg Oral Daily  . piperacillin-tazobactam (ZOSYN)  IV  3.375 g Intravenous Q8H   Continuous Infusions: . sodium chloride 75 mL/hr at 12/05/15 0943     LOS: 4 days    Time spent: 25 min    [place], DO [facility] Pager [phone]  If 7PM-7AM, please contact night-coverage [email or web address] Password [facility] 12/06/2015, 2:27 PM

## 2015-12-06 NOTE — Progress Notes (Signed)
PT Cancellation Note  Patient Details Name: Lori Burnett MRN: [id number] DOB: 1936/05/13   Cancelled Treatment:    Reason Eval/Treat Not Completed: Patient at procedure or test/unavailable.  Attempted Eval, however transport arrived prior to starting mobility.  Will f/u another time.     Catarina Hartshorn, [place] 12/06/2015, 11:00 AM

## 2015-12-06 NOTE — Progress Notes (Signed)
  Echocardiogram 2D Echocardiogram has been performed.  Diamond Nickel 12/06/2015, 12:07 PM

## 2015-12-06 NOTE — Progress Notes (Signed)
Patient Name: Lori Burnett Date of Encounter: 12/06/2015   Primary Cardiologist: Dr. Janith Lima in [place], [place].   Patient Profile: Lori Burnett is a 80 y.o. female with a h/o morbid obesity, chronic lung disease on home O2, CAD s/p CABG, colon cancer s/p colectomy, breast cancer s/p left mastectomy, hernia repair x2, diabetes, CKD, and sleep apnea who presents on transfer from [facility] for further management of N/V. She has been found to have acute pancreatitis, choledocholithiasis, pericholecystic fluid, and gallbladder wall thickening.   SUBJECTIVE: Feels ok, denies pain in abdomen. No nausea or vomiting.    OBJECTIVE Filed Vitals:   12/05/15 1424 12/05/15 2144 12/06/15 0522 12/06/15 0810  BP: 161/71 144/62 160/69   Pulse: 74 87 79   Temp: 98.3 F (36.8 C) 97.7 F (36.5 C) 98.9 F (37.2 C)   TempSrc: Oral Oral Oral   Resp: 18     Height:      Weight:      SpO2: 100% 93% 98% 94%    Intake/Output Summary (Last 24 hours) at 12/06/15 1002 Last data filed at 12/06/15 0901  Gross per 24 hour  Intake    932 ml  Output    450 ml  Net    482 ml   Filed Weights   12/02/15 0452 12/03/15 1341  Weight: 241 lb 13.5 oz (109.7 kg) 241 lb (109.317 kg)    PHYSICAL EXAM General: Well developed, well nourished,obese female in no acute distress. Head: Normocephalic, atraumatic.  Neck: Supple without bruits, No JVD. Lungs:  Resp regular and unlabored, scattered wheezes Heart: RRR, S1, S2, no S3, S4, or murmur; no rub. Abdomen: Soft, non-tender, non-distended, BS + x 4.  Extremities: No clubbing, cyanosis,generalized edema.  Neuro: Alert and oriented X 3. Moves all extremities spontaneously. Psych: Normal affect.  LABS: CBC: Recent Labs  12/05/15 0247  WBC 13.5*  HGB 12.6  HCT 39.2  MCV 88.9  PLT [id number]   Basic Metabolic Panel: Recent Labs  12/04/15 0340 12/05/15 0247  NA 143 148*  K 5.4* 3.7  CL 109 110  CO2 25 28  GLUCOSE 107* 119*  BUN 22* 13  CREATININE  1.98* 1.52*  CALCIUM 8.4* 9.0   Liver Function Tests: Recent Labs  12/04/15 0340 12/05/15 0247  AST 117* 60*  ALT 64* 53  ALKPHOS 174* 206*  BILITOT 5.1* 3.0*  PROT 5.3* 5.6*  ALBUMIN 2.3* 2.1*     Current facility-administered medications:  .  0.45 % sodium chloride infusion, , Intravenous, Continuous, Geradine Girt, DO, Last Rate: 75 mL/hr at 12/05/15 0943 .  amiodarone (PACERONE) tablet 200 mg, 200 mg, Oral, Daily, Etta Quill, DO, 200 mg at 12/05/15 0943 .  antiseptic oral rinse (CPC / CETYLPYRIDINIUM CHLORIDE 0.05%) solution 7 mL, 7 mL, Mouth Rinse, BID, Jessica U Vann, DO, 7 mL at 12/05/15 2108 .  budesonide (PULMICORT) nebulizer solution 0.5 mg, 0.5 mg, Nebulization, Daily, Etta Quill, DO, 0.5 mg at 12/06/15 0810 .  insulin aspart (novoLOG) injection 0-15 Units, 0-15 Units, Subcutaneous, Q4H, Etta Quill, DO, 2 Units at 12/06/15 0030 .  montelukast (SINGULAIR) tablet 10 mg, 10 mg, Oral, QHS, Etta Quill, DO, 10 mg at 12/05/15 2103 .  morphine 2 MG/ML injection 2-4 mg, 2-4 mg, Intravenous, Q4H PRN, Etta Quill, DO, 4 mg at 12/02/15 2303 .  ondansetron (ZOFRAN) injection 4 mg, 4 mg, Intravenous, Q6H PRN, Etta Quill, DO .  pantoprazole (PROTONIX) EC tablet 40 mg,  40 mg, Oral, Daily, Etta Quill, DO, 40 mg at 12/05/15 0943 .  PARoxetine (PAXIL) tablet 40 mg, 40 mg, Oral, Daily, Etta Quill, DO, 40 mg at 12/05/15 0943 .  piperacillin-tazobactam (ZOSYN) IVPB 3.375 g, 3.375 g, Intravenous, Q8H, Franky Macho, RPH, 3.375 g at 12/06/15 0530 . sodium chloride 75 mL/hr at 12/05/15 [id number]   TELE: Not on telemetry     ECG: NSR   Radiology/Studies: Dg Chest 2 View  12/04/2015  CLINICAL DATA:  Preop cardiovascular exam 07/30/2013 EXAM: CHEST  2 VIEW COMPARISON:  None. FINDINGS: Internal jugular central line tip projects over the superior vena cava. Stable cardiac enlargement. There is vascular congestion with no definite pulmonary edema. Mild blunting  left costophrenic angle may indicate small pleural effusion versus chronic pleural thickening. Study is limited by body habitus. IMPRESSION: Study limited by body habitus. Stable cardiac enlargement with vascular congestion. Mild left pleural thickening versus tiny left effusion. Electronically Signed   By: Skipper Cliche M.D.   On: 12/04/2015 17:09     Current Medications:  . amiodarone  200 mg Oral Daily  . antiseptic oral rinse  7 mL Mouth Rinse BID  . budesonide (PULMICORT) nebulizer solution  0.5 mg Nebulization Daily  . insulin aspart  0-15 Units Subcutaneous Q4H  . montelukast  10 mg Oral QHS  . pantoprazole  40 mg Oral Daily  . PARoxetine  40 mg Oral Daily  . piperacillin-tazobactam (ZOSYN)  IV  3.375 g Intravenous Q8H   . sodium chloride 75 mL/hr at 12/05/15 0943    ASSESSMENT AND PLAN: Principal Problem:   Choledocholithiasis with acute cholecystitis with obstruction Active Problems:   OSA (obstructive sleep apnea)   Morbid obesity (HCC)   DM2 (diabetes mellitus, type 2) (HCC)   HTN (hypertension)   Leukocytosis   AKI (acute kidney injury) ([place])   Acute gallstone pancreatitis   Abdominal pain   Preop cardiovascular exam   Coronary artery disease involving coronary bypass graft of native heart without angina pectoris  1. History of CAD: Patient is s/p CABG in 2014. Records pending from Dr. Richrd Sox office, their office is closed today due to power outage. Spoke with RN who has initiated this process. She is on amiodarone but is unsure why. Not on any other CV meds. Echo is still pending, I have called and asked them to expedite. Will review cardiology records when they arrive.   2. Choledocholithiasis, with acute cholecystitis, obstruction, and acute gallstone pancreatitis:  ERCP showed purulent cholangitis. Small flecks of stone debris removed from bile duct. Per surgery: it may be in the patient's best interest to treat non operatively depending on cardiology's final  recommendations.    3. Preoperative evaluation for surgery: awaiting echo and records from Cardiology office. Patient is at high risk given chronic lung disease on home 02, morbid obesity and acute renal failure.      Signed, Arbutus Leas , NP 10:02 AM 12/06/2015 Pager [phone]  I have examined the patient and reviewed assessment and plan and discussed with patient.  Agree with above as stated.  Patient with CABG in 2014.  She did not report angina.  She appears deconditioned and had some DOE.  Her deconditioning is from multiple factors including age and obesity and is not easily modifiable.  Await records from her cardiologist.  Although her risk for surgery will be moderate, she appears stable and I don't think it would be prohibitive- especially if surgery is the best treatment for  her.  2015 echo showed normal LVEF.  Larae Grooms

## 2015-12-07 DIAGNOSIS — I2510 Atherosclerotic heart disease of native coronary artery without angina pectoris: Secondary | ICD-10-CM | POA: Insufficient documentation

## 2015-12-07 LAB — COMPREHENSIVE METABOLIC PANEL
ALT: 42 U/L (ref 14–54)
ANION GAP: 7 (ref 5–15)
AST: 44 U/L — ABNORMAL HIGH (ref 15–41)
Albumin: 2.1 g/dL — ABNORMAL LOW (ref 3.5–5.0)
Alkaline Phosphatase: 217 U/L — ABNORMAL HIGH (ref 38–126)
BUN: 9 mg/dL (ref 6–20)
CHLORIDE: 104 mmol/L (ref 101–111)
CO2: 31 mmol/L (ref 22–32)
Calcium: 8.9 mg/dL (ref 8.9–10.3)
Creatinine, Ser: 1.13 mg/dL — ABNORMAL HIGH (ref 0.44–1.00)
GFR, EST AFRICAN AMERICAN: 53 mL/min — AB (ref >60)
GFR, EST NON AFRICAN AMERICAN: 46 mL/min — AB (ref >60)
Glucose, Bld: 136 mg/dL — ABNORMAL HIGH (ref 65–99)
POTASSIUM: 3.6 mmol/L (ref 3.5–5.1)
Sodium: 142 mmol/L (ref 135–145)
Total Bilirubin: 1.6 mg/dL — ABNORMAL HIGH (ref 0.3–1.2)
Total Protein: 5.4 g/dL — ABNORMAL LOW (ref 6.5–8.1)

## 2015-12-07 LAB — GLUCOSE, CAPILLARY
GLUCOSE-CAPILLARY: 169 mg/dL — AB (ref 65–99)
GLUCOSE-CAPILLARY: 167 mg/dL — AB (ref 65–99)
GLUCOSE-CAPILLARY: 171 mg/dL — AB (ref 65–99)
Glucose-Capillary: 129 mg/dL — ABNORMAL HIGH (ref 65–99)
Glucose-Capillary: 137 mg/dL — ABNORMAL HIGH (ref 65–99)
Glucose-Capillary: 147 mg/dL — ABNORMAL HIGH (ref 65–99)

## 2015-12-07 LAB — CBC
HEMATOCRIT: 38.8 % (ref 36.0–46.0)
Hemoglobin: 12.2 g/dL (ref 12.0–15.0)
MCH: 28.3 pg (ref 26.0–34.0)
MCHC: 31.4 g/dL (ref 30.0–36.0)
MCV: 90 fL (ref 78.0–100.0)
PLATELETS: 198 10*3/uL (ref 150–400)
RBC: 4.31 MIL/uL (ref 3.87–5.11)
RDW: 15.6 % — ABNORMAL HIGH (ref 11.5–15.5)
WBC: 11 10*3/uL — AB (ref 4.0–10.5)

## 2015-12-07 NOTE — Progress Notes (Signed)
Patient ID: Lori Burnett, female   DOB: 25-Apr-1936, 80 y.o.   MRN: [id number]     [facility]      [address] [place], [place] [address]    Phone: [phone] FAX: [phone]     Subjective: Pt sitting up in a chair. No pain, but endorses to nausea. Tolerating clears. LFTs down.   Objective:  Vital signs:  Filed Vitals:   12/06/15 2119 12/06/15 2230 12/07/15 0529 12/07/15 0830  BP: 157/72  160/78   Pulse: 80 77 77   Temp: 98.4 F (36.9 C)  99 F (37.2 C)   TempSrc: Oral  Axillary   Resp: 17 20 18    Height:      Weight:      SpO2: 100% 100% 99% 98%    Last BM Date: 12/06/15  Intake/Output   Yesterday:  06/19 0701 - 06/20 0700 In: 480 [P.O.:480] Out: 850 [Urine:850] This shift:  Total I/O In: -  Out: 450 [Urine:450]   Physical Exam: General: Pt awake/alert/oriented x4 in no acute distress  Abdomen: Soft.  Nondistended.  Non tender.  No evidence of peritonitis.  No incarcerated hernias.    Problem List:   Principal Problem:   Choledocholithiasis with acute cholecystitis with obstruction Active Problems:   OSA (obstructive sleep apnea)   Morbid obesity (HCC)   DM2 (diabetes mellitus, type 2) (HCC)   HTN (hypertension)   Leukocytosis   AKI (acute kidney injury) ([place])   Acute gallstone pancreatitis   Abdominal pain   Preop cardiovascular exam   Coronary artery disease involving coronary bypass graft of native heart without angina pectoris   LFT elevation    Results:   Labs: Results for orders placed or performed during the hospital encounter of 12/02/15 (from the past 48 hour(s))  Glucose, capillary     Status: Abnormal   Collection Time: 12/05/15 12:03 PM  Result Value Ref Range   Glucose-Capillary 117 (H) 65 - 99 mg/dL  Glucose, capillary     Status: Abnormal   Collection Time: 12/05/15  4:17 PM  Result Value Ref Range   Glucose-Capillary 124 (H) 65 - 99 mg/dL  Glucose, capillary      Status: Abnormal   Collection Time: 12/05/15  8:26 PM  Result Value Ref Range   Glucose-Capillary 130 (H) 65 - 99 mg/dL  Glucose, capillary     Status: Abnormal   Collection Time: 12/06/15 12:07 AM  Result Value Ref Range   Glucose-Capillary 121 (H) 65 - 99 mg/dL  Glucose, capillary     Status: Abnormal   Collection Time: 12/06/15  5:27 AM  Result Value Ref Range   Glucose-Capillary 119 (H) 65 - 99 mg/dL  Glucose, capillary     Status: Abnormal   Collection Time: 12/06/15  7:38 AM  Result Value Ref Range   Glucose-Capillary 119 (H) 65 - 99 mg/dL   Comment 1 Notify RN   CBC     Status: Abnormal   Collection Time: 12/06/15 10:32 AM  Result Value Ref Range   WBC 10.8 (H) 4.0 - 10.5 K/uL   RBC 3.92 3.87 - 5.11 MIL/uL   Hemoglobin 11.2 (L) 12.0 - 15.0 g/dL   HCT 35.3 (L) 36.0 - 46.0 %   MCV 90.1 78.0 - 100.0 fL   MCH 28.6 26.0 - 34.0 pg   MCHC 31.7 30.0 - 36.0 g/dL   RDW 15.6 (H) 11.5 - 15.5 %   Platelets 164 150 - 400  K/uL  Glucose, capillary     Status: Abnormal   Collection Time: 12/06/15 12:22 PM  Result Value Ref Range   Glucose-Capillary 115 (H) 65 - 99 mg/dL   Comment 1 Notify RN   Glucose, capillary     Status: Abnormal   Collection Time: 12/06/15  4:26 PM  Result Value Ref Range   Glucose-Capillary 191 (H) 65 - 99 mg/dL   Comment 1 Notify RN   Glucose, capillary     Status: Abnormal   Collection Time: 12/06/15  8:09 PM  Result Value Ref Range   Glucose-Capillary 170 (H) 65 - 99 mg/dL  Glucose, capillary     Status: Abnormal   Collection Time: 12/07/15 12:06 AM  Result Value Ref Range   Glucose-Capillary 137 (H) 65 - 99 mg/dL  Glucose, capillary     Status: Abnormal   Collection Time: 12/07/15  4:17 AM  Result Value Ref Range   Glucose-Capillary 147 (H) 65 - 99 mg/dL  Comprehensive metabolic panel     Status: Abnormal   Collection Time: 12/07/15  5:11 AM  Result Value Ref Range   Sodium 142 135 - 145 mmol/L   Potassium 3.6 3.5 - 5.1 mmol/L   Chloride 104 101  - 111 mmol/L   CO2 31 22 - 32 mmol/L   Glucose, Bld 136 (H) 65 - 99 mg/dL   BUN 9 6 - 20 mg/dL   Creatinine, Ser 1.13 (H) 0.44 - 1.00 mg/dL   Calcium 8.9 8.9 - 10.3 mg/dL   Total Protein 5.4 (L) 6.5 - 8.1 g/dL   Albumin 2.1 (L) 3.5 - 5.0 g/dL   AST 44 (H) 15 - 41 U/L   ALT 42 14 - 54 U/L   Alkaline Phosphatase 217 (H) 38 - 126 U/L   Total Bilirubin 1.6 (H) 0.3 - 1.2 mg/dL   GFR calc non Af Amer 46 (L) >60 mL/min   GFR calc Af Amer 53 (L) >60 mL/min    Comment: (NOTE) The eGFR has been calculated using the CKD EPI equation. This calculation has not been validated in all clinical situations. eGFR's persistently <60 mL/min signify possible Chronic Kidney Disease.    Anion gap 7 5 - 15  CBC     Status: Abnormal   Collection Time: 12/07/15  5:11 AM  Result Value Ref Range   WBC 11.0 (H) 4.0 - 10.5 K/uL    Comment: REPEATED TO VERIFY WHITE COUNT CONFIRMED ON SMEAR    RBC 4.31 3.87 - 5.11 MIL/uL   Hemoglobin 12.2 12.0 - 15.0 g/dL   HCT 38.8 36.0 - 46.0 %   MCV 90.0 78.0 - 100.0 fL   MCH 28.3 26.0 - 34.0 pg   MCHC 31.4 30.0 - 36.0 g/dL   RDW 15.6 (H) 11.5 - 15.5 %   Platelets 198 150 - 400 K/uL    Comment: SPECIMEN CHECKED FOR CLOTS REPEATED TO VERIFY   Glucose, capillary     Status: Abnormal   Collection Time: 12/07/15  8:06 AM  Result Value Ref Range   Glucose-Capillary 129 (H) 65 - 99 mg/dL   Comment 1 Notify RN     Imaging / Studies: No results found.  Medications / Allergies:  Scheduled Meds: . amiodarone  200 mg Oral Daily  . antiseptic oral rinse  7 mL Mouth Rinse BID  . budesonide (PULMICORT) nebulizer solution  0.5 mg Nebulization Daily  . insulin aspart  0-15 Units Subcutaneous Q4H  . montelukast  10 mg Oral QHS  .  pantoprazole  40 mg Oral Daily  . PARoxetine  40 mg Oral Daily  . piperacillin-tazobactam (ZOSYN)  IV  3.375 g Intravenous Q8H   Continuous Infusions:  PRN Meds:.morphine injection, ondansetron (ZOFRAN) IV  Antibiotics: Anti-infectives     Start     Dose/Rate Route Frequency Ordered Stop   12/02/15 1400  piperacillin-tazobactam (ZOSYN) IVPB 3.375 g     3.375 g 12.5 mL/hr over 240 Minutes Intravenous Every 8 hours 12/02/15 0548     12/02/15 0600  Ampicillin-Sulbactam (UNASYN) 3 g in sodium chloride 0.9 % 100 mL IVPB  Status:  Discontinued     3 g 100 mL/hr over 60 Minutes Intravenous Every 8 hours 12/02/15 0514 12/02/15 0534   12/02/15 0600  piperacillin-tazobactam (ZOSYN) IVPB 3.375 g     3.375 g 100 mL/hr over 30 Minutes Intravenous STAT 12/02/15 0548 12/02/15 0640        Assessment/Plan Choledocholithiasis, pancreatitis, cholelithiasis (possible cholecystitis on CT) S/p ERCP sphincterotomy   Continue with non operative management.  Patient would prefer this route as well. Advance diet.  Continue zosyn. Per cards moderate surgical risk candidate.   Erby Pian, [facility] Pager [phone]) For consults and floor pages call [phone])  12/07/2015 9:33 AM

## 2015-12-07 NOTE — Progress Notes (Signed)
PROGRESS NOTE    Lori Burnett  [id number] DOB: 1935/06/23 DOA: 12/02/2015 PCP: Normand Sloop, MD   Outpatient Specialists:     Brief Narrative:  Lori Burnett is a 80 y.o. female with medical history significant of DM, HTN, HVOS, CAD. Patient presents to the ED at [facility] with c/o N/V, abdominal pain radiating to both flanks and back. Symptoms onset yesterday morning. Have worsened earlier in the evening which prompted her to go to [facility].  Work up in the ED at [facility] demonstrated acute pancreatitis with lipase > 30k, CT scan showed "mild acute pancreatitis", US gallbladder showed distended bile duct, cholecystitis, cholelithiasis, and clinical picture that was suspicious for choledocholithiasis.  Due to patient likely needing MRCP / ERCP, she was transferred to [facility].  Unable to have MRCP due to size, ERCP done and also had biliary sphinctertomy and balloon sweeping.  Deciding between conservative non-op management vs surgery.  Await record from primary cards in [place].   Assessment & Plan:   Principal Problem:   Choledocholithiasis with acute cholecystitis with obstruction Active Problems:   OSA (obstructive sleep apnea)   Morbid obesity (HCC)   DM2 (diabetes mellitus, type 2) (HCC)   HTN (hypertension)   Leukocytosis   AKI (acute kidney injury) ([place])   Acute gallstone pancreatitis   Abdominal pain   Preop cardiovascular exam   Coronary artery disease involving coronary bypass graft of native heart without angina pectoris   LFT elevation   Choledocholithiasis, with acute cholecystitis, obstruction, and acute gallstone pancreatitis - Zosyn ERCP as patient unable to fit in MRCP machine- done 6/16 Purulent cholangitis, small flecks of stonedebris were removed from  the bile duct after biliary sphinctertomy and balloon sweeping. Morphine PRN pain Zofran PRN nausea per general surgery: proceed with cholecystectomy once  pancreatitis resolves vs conservative management--  Consulted card-- echo done-- patient most likely will be high risk from both pulm and cardiology standpoint-- had CABG in 2014 and was not able to be extubated and spent > 1 month in [facility] with trach  AKI - likely pre-renal in setting of pancreatitis and borderline low BPs. much improved  Holding ACE/duiretic for now  Hypernatremia -resolved with PO water intake  HTN - holding BP meds  DM2 - Patient and family are unsure of her home insulin dosing Will put her on SSI Q4H moderate scale for now as she is NPO, may require upping to resistant scale as she does take 70 units of lantus daily.  Morbid obesity - BMI 73 Was very difficult to wean from vent post op from CABG in 2015, required trach and spending what looks like a month in the LTAC here weaning according to PCCM notes  OSA - BIPAP per home settings at bed time  Chronic respiratory failure -wears 2.5L O2 at home  Hyperkalemia -resolved   DVT prophylaxis:  SCD's  Code Status: Full Code   Family Communication: Daughter at bedside 6/18  Disposition Plan:     Consultants:   GI  General surgery  cardiology  Procedures:   ERCP     Subjective: Some nausea this AM  Objective: Filed Vitals:   12/06/15 2119 12/06/15 2230 12/07/15 0529 12/07/15 0830  BP: 157/72  160/78   Pulse: 80 77 77   Temp: 98.4 F (36.9 C)  99 F (37.2 C)   TempSrc: Oral  Axillary   Resp: 17 20 18    Height:      Weight:      SpO2: 100% 100% 99% 98%  Intake/Output Summary (Last 24 hours) at 12/07/15 0903 Last data filed at 12/06/15 1943  Gross per 24 hour  Intake    480 ml  Output    400 ml  Net     80 ml   Filed Weights   12/02/15 0452 12/03/15 1341  Weight: 109.7 kg (241 lb 13.5 oz) 109.317 kg (241 lb)    Examination:  General exam: Appears calm and comfortable -wears 2.5L O2 at home Respiratory system:  diminished, no wheezing Cardiovascular system: S1 & S2 heard, RRR. No JVD, murmurs, rubs, gallops or clicks. No pedal edema. Gastrointestinal system: tender, obese     Data Reviewed: I have personally reviewed following labs and imaging studies  CBC:  Recent Labs Lab 12/02/15 0923 12/03/15 0649 12/05/15 0247 12/06/15 1032 12/07/15 0511  WBC 20.9* 12.9* 13.5* 10.8* 11.0*  HGB 12.5 12.0 12.6 11.2* 12.2  HCT 39.1 37.5 39.2 35.3* 38.8  MCV 89.7 89.1 88.9 90.1 90.0  PLT 203 182 182 164 [id number]   Basic Metabolic Panel:  Recent Labs Lab 12/02/15 0923 12/03/15 0649 12/04/15 0340 12/05/15 0247 12/07/15 0511  NA 145 144 143 148* 142  K 3.6 3.5 5.4* 3.7 3.6  CL 104 110 109 110 104  CO2 28 25 25 28 31   GLUCOSE 177* 104* 107* 119* 136*  BUN 21* 24* 22* 13 9  CREATININE 2.63* 2.47* 1.98* 1.52* 1.13*  CALCIUM 8.7* 8.4* 8.4* 9.0 8.9   GFR: Estimated Creatinine Clearance: 49.5 mL/min (by C-G formula based on Cr of 1.13). Liver Function Tests:  Recent Labs Lab 12/02/15 0923 12/03/15 0649 12/04/15 0340 12/05/15 0247 12/07/15 0511  AST 117* 112* 117* 60* 44*  ALT 55* 70* 64* 53 42  ALKPHOS 67 98 174* 206* 217*  BILITOT 3.9* 3.5* 5.1* 3.0* 1.6*  PROT 5.8* 5.7* 5.3* 5.6* 5.4*  ALBUMIN 2.6* 2.4* 2.3* 2.1* 2.1*    Recent Labs Lab 12/03/15 0649 12/04/15 0340  LIPASE 277* 91*   No results for input(s): AMMONIA in the last 168 hours. Coagulation Profile: No results for input(s): INR, PROTIME in the last 168 hours. Cardiac Enzymes: No results for input(s): CKTOTAL, CKMB, CKMBINDEX, TROPONINI in the last 168 hours. BNP (last 3 results) No results for input(s): PROBNP in the last 8760 hours. HbA1C: No results for input(s): HGBA1C in the last 72 hours. CBG:  Recent Labs Lab 12/06/15 1626 12/06/15 2009 12/07/15 0006 12/07/15 0417 12/07/15 0806  GLUCAP 191* 170* 137* 147* 129*   Lipid Profile: No results for input(s): CHOL, HDL, LDLCALC, TRIG, CHOLHDL, LDLDIRECT in  the last 72 hours. Thyroid Function Tests: No results for input(s): TSH, T4TOTAL, FREET4, T3FREE, THYROIDAB in the last 72 hours. Anemia Panel: No results for input(s): VITAMINB12, FOLATE, FERRITIN, TIBC, IRON, RETICCTPCT in the last 72 hours. Urine analysis:    Component Value Date/Time   COLORURINE AMBER* 12/02/2015 1526   APPEARANCEUR TURBID* 12/02/2015 1526   LABSPEC 1.026 12/02/2015 1526   PHURINE 5.0 12/02/2015 1526   GLUCOSEU NEGATIVE 12/02/2015 1526   HGBUR NEGATIVE 12/02/2015 1526   BILIRUBINUR MODERATE* 12/02/2015 1526   KETONESUR 15* 12/02/2015 1526   PROTEINUR 30* 12/02/2015 1526   UROBILINOGEN 0.2 06/27/2013 0714   NITRITE NEGATIVE 12/02/2015 1526   LEUKOCYTESUR LARGE* 12/02/2015 1526     )No results found for this or any previous visit (from the past 240 hour(s)).    Anti-infectives    Start     Dose/Rate Route Frequency Ordered Stop   12/02/15 1400  piperacillin-tazobactam (ZOSYN) IVPB  3.375 g     3.375 g 12.5 mL/hr over 240 Minutes Intravenous Every 8 hours 12/02/15 0548     12/02/15 0600  Ampicillin-Sulbactam (UNASYN) 3 g in sodium chloride 0.9 % 100 mL IVPB  Status:  Discontinued     3 g 100 mL/hr over 60 Minutes Intravenous Every 8 hours 12/02/15 0514 12/02/15 0534   12/02/15 0600  piperacillin-tazobactam (ZOSYN) IVPB 3.375 g     3.375 g 100 mL/hr over 30 Minutes Intravenous STAT 12/02/15 0548 12/02/15 0640       Radiology Studies: No results found.      Scheduled Meds: . amiodarone  200 mg Oral Daily  . antiseptic oral rinse  7 mL Mouth Rinse BID  . budesonide (PULMICORT) nebulizer solution  0.5 mg Nebulization Daily  . insulin aspart  0-15 Units Subcutaneous Q4H  . montelukast  10 mg Oral QHS  . pantoprazole  40 mg Oral Daily  . PARoxetine  40 mg Oral Daily  . piperacillin-tazobactam (ZOSYN)  IV  3.375 g Intravenous Q8H   Continuous Infusions:     LOS: 5 days    Time spent: 25 min    [place], DO [facility] Pager  [phone]  If 7PM-7AM, please contact night-coverage [email or web address] Password [email or web address] 12/07/2015, 9:03 AM

## 2015-12-07 NOTE — Care Management (Signed)
Discussed home health with patient again . She has not chosen a home health agency . Patient does have list of choice at bedside. Will follow up again tomorrow.  Patient consented to [facility] calling daughter.  Called daughter , no answer and no voice mail.  Magdalen Spatz RN BSN [phone]

## 2015-12-07 NOTE — Progress Notes (Signed)
SUBJECTIVE:  Nochest pain  OBJECTIVE:   Vitals:   Filed Vitals:   12/07/15 0529 12/07/15 0830 12/07/15 0900 12/07/15 0933  BP: 160/78   146/63  Pulse: 77   80  Temp: 99 F (37.2 C)  98 F (36.7 C) 98 F (36.7 C)  TempSrc: Axillary   Oral  Resp: 18   18  Height:      Weight:      SpO2: 99% 98%  94%   I&O's:   Intake/Output Summary (Last 24 hours) at 12/07/15 1007 Last data filed at 12/07/15 0900  Gross per 24 hour  Intake    480 ml  Output    850 ml  Net   -370 ml       PHYSICAL EXAM General: Well developed, well nourished, in no acute distress Head:   Normal cephalic and atramatic  Lungs:   Clear bilaterally to auscultation. Heart:   HRRR S1 S2  No JVD.   Abdomen: abdomen soft and non-tender, obese Msk:  Back normal,  Normal strength and tone for age. Extremities:   No edema.   Neuro: Alert and oriented. Psych:  Normal affect, responds appropriately Skin: No rash   LABS: Basic Metabolic Panel:  Recent Labs  12/05/15 0247 12/07/15 0511  NA 148* 142  K 3.7 3.6  CL 110 104  CO2 28 31  GLUCOSE 119* 136*  BUN 13 9  CREATININE 1.52* 1.13*  CALCIUM 9.0 8.9   Liver Function Tests:  Recent Labs  12/05/15 0247 12/07/15 0511  AST 60* 44*  ALT 53 42  ALKPHOS 206* 217*  BILITOT 3.0* 1.6*  PROT 5.6* 5.4*  ALBUMIN 2.1* 2.1*   No results for input(s): LIPASE, AMYLASE in the last 72 hours. CBC:  Recent Labs  12/06/15 1032 12/07/15 0511  WBC 10.8* 11.0*  HGB 11.2* 12.2  HCT 35.3* 38.8  MCV 90.1 90.0  PLT 164 198   Cardiac Enzymes: No results for input(s): CKTOTAL, CKMB, CKMBINDEX, TROPONINI in the last 72 hours. BNP: Invalid input(s): POCBNP D-Dimer: No results for input(s): DDIMER in the last 72 hours. Hemoglobin A1C: No results for input(s): HGBA1C in the last 72 hours. Fasting Lipid Panel: No results for input(s): CHOL, HDL, LDLCALC, TRIG, CHOLHDL, LDLDIRECT in the last 72 hours. Thyroid Function Tests: No results for input(s): TSH,  T4TOTAL, T3FREE, THYROIDAB in the last 72 hours.  Invalid input(s): FREET3 Anemia Panel: No results for input(s): VITAMINB12, FOLATE, FERRITIN, TIBC, IRON, RETICCTPCT in the last 72 hours. Coag Panel:   Lab Results  Component Value Date   INR 1.15 07/06/2013   INR 1.21 07/02/2013    RADIOLOGY: Dg Chest 2 View  12/04/2015  CLINICAL DATA:  Preop cardiovascular exam 07/30/2013 EXAM: CHEST  2 VIEW COMPARISON:  None. FINDINGS: Internal jugular central line tip projects over the superior vena cava. Stable cardiac enlargement. There is vascular congestion with no definite pulmonary edema. Mild blunting left costophrenic angle may indicate small pleural effusion versus chronic pleural thickening. Study is limited by body habitus. IMPRESSION: Study limited by body habitus. Stable cardiac enlargement with vascular congestion. Mild left pleural thickening versus tiny left effusion. Electronically Signed   By: Skipper Cliche M.D.   On: 12/04/2015 17:09   Dg Ercp Biliary & Pancreatic Ducts  12/03/2015  CLINICAL DATA:  ERCP EXAM: ERCP TECHNIQUE: Multiple spot images obtained with the fluoroscopic device and submitted for interpretation post-procedure. FLUOROSCOPY TIME:  Radiation Exposure Index (as provided by the fluoroscopic device): If the device  does not provide the exposure index: Fluoroscopy Time:  3 minutes and 8 seconds Number of Acquired Images:  For images COMPARISON:  None. FINDINGS: Contrast fills the common bile duct. A balloon has been utilized to sweep the common bile duct. IMPRESSION: See above. These images were submitted for radiologic interpretation only. Please see the procedural report for the amount of contrast and the fluoroscopy time utilized. Electronically Signed   By: Marybelle Killings M.D.   On: 12/03/2015 16:48      ASSESSMENT: Kathyrn Lass:    1) Preop eval:  CABG in 2014. No angina.  Morbid obesity, home oxygen and deconditioning add to her surgical risk.  At this point, it appears she  is being managed conservatively.    Normal LV function in 2015.  Jettie Booze, MD  12/07/2015  10:07 AM

## 2015-12-07 NOTE — Progress Notes (Signed)
Patient had loss of iv access around 1520. Zosyn antibiotic stopped and not finished. IV team consulted. IV nurse attempted start but was unsuccessful. A second IV nurse will come and assess patient to retry.

## 2015-12-08 LAB — CBC
HCT: 39.6 % (ref 36.0–46.0)
Hemoglobin: 12.8 g/dL (ref 12.0–15.0)
MCH: 28.9 pg (ref 26.0–34.0)
MCHC: 32.3 g/dL (ref 30.0–36.0)
MCV: 89.4 fL (ref 78.0–100.0)
Platelets: 188 10*3/uL (ref 150–400)
RBC: 4.43 MIL/uL (ref 3.87–5.11)
RDW: 15.5 % (ref 11.5–15.5)
WBC: 10.1 10*3/uL (ref 4.0–10.5)

## 2015-12-08 LAB — COMPREHENSIVE METABOLIC PANEL
ALK PHOS: 204 U/L — AB (ref 38–126)
ALT: 39 U/L (ref 14–54)
ANION GAP: 4 — AB (ref 5–15)
AST: 36 U/L (ref 15–41)
Albumin: 2.1 g/dL — ABNORMAL LOW (ref 3.5–5.0)
BILIRUBIN TOTAL: 1.2 mg/dL (ref 0.3–1.2)
BUN: 7 mg/dL (ref 6–20)
CO2: 35 mmol/L — ABNORMAL HIGH (ref 22–32)
Calcium: 9.1 mg/dL (ref 8.9–10.3)
Chloride: 105 mmol/L (ref 101–111)
Creatinine, Ser: 1.01 mg/dL — ABNORMAL HIGH (ref 0.44–1.00)
GFR, EST NON AFRICAN AMERICAN: 52 mL/min — AB (ref >60)
Glucose, Bld: 126 mg/dL — ABNORMAL HIGH (ref 65–99)
POTASSIUM: 3.3 mmol/L — AB (ref 3.5–5.1)
SODIUM: 144 mmol/L (ref 135–145)
TOTAL PROTEIN: 5.5 g/dL — AB (ref 6.5–8.1)

## 2015-12-08 LAB — GLUCOSE, CAPILLARY
GLUCOSE-CAPILLARY: 102 mg/dL — AB (ref 65–99)
GLUCOSE-CAPILLARY: 136 mg/dL — AB (ref 65–99)
GLUCOSE-CAPILLARY: 140 mg/dL — AB (ref 65–99)
GLUCOSE-CAPILLARY: 145 mg/dL — AB (ref 65–99)
GLUCOSE-CAPILLARY: 147 mg/dL — AB (ref 65–99)
Glucose-Capillary: 159 mg/dL — ABNORMAL HIGH (ref 65–99)

## 2015-12-08 MED ORDER — POTASSIUM CHLORIDE CRYS ER 10 MEQ PO TBCR
30.0000 meq | EXTENDED_RELEASE_TABLET | ORAL | Status: AC
Start: 2015-12-08 — End: 2015-12-08
  Administered 2015-12-08 (×2): 30 meq via ORAL
  Filled 2015-12-08 (×2): qty 1

## 2015-12-08 MED ORDER — INSULIN ASPART 100 UNIT/ML ~~LOC~~ SOLN
0.0000 [IU] | Freq: Three times a day (TID) | SUBCUTANEOUS | Status: DC
  Administered 2015-12-09: 5 [IU] via SUBCUTANEOUS
  Administered 2015-12-09 – 2015-12-10 (×3): 2 [IU] via SUBCUTANEOUS
  Administered 2015-12-10 (×2): 5 [IU] via SUBCUTANEOUS

## 2015-12-08 MED ORDER — POLYETHYLENE GLYCOL 3350 17 G PO PACK
34.0000 g | PACK | Freq: Once | ORAL | Status: AC
  Administered 2015-12-08: 34 g via ORAL
  Filled 2015-12-08: qty 2

## 2015-12-08 MED ORDER — INSULIN GLARGINE 100 UNIT/ML ~~LOC~~ SOLN
10.0000 [IU] | Freq: Every day | SUBCUTANEOUS | Status: DC
  Administered 2015-12-08 – 2015-12-10 (×3): 10 [IU] via SUBCUTANEOUS
  Filled 2015-12-08 (×3): qty 0.1

## 2015-12-08 NOTE — Progress Notes (Signed)
Patient Name: Lori Burnett Date of Encounter: 12/08/2015  Principal Problem:   Choledocholithiasis with acute cholecystitis with obstruction Active Problems:   OSA (obstructive sleep apnea)   Morbid obesity ([place])   DM2 (diabetes mellitus, type 2) (HCC)   HTN (hypertension)   Leukocytosis   AKI (acute kidney injury) ([place])   Acute gallstone pancreatitis   Abdominal pain   Preop cardiovascular exam   Coronary artery disease involving coronary bypass graft of native heart without angina pectoris   LFT elevation   Coronary artery disease involving native coronary artery of native heart without angina pectoris   Primary Cardiologist: Dr. Janith Lima in [place], [place].  Patient Profile:Lori Burnett is a 80 y.o. female with a h/o morbid obesity, chronic lung disease on home O2, CAD s/p CABG, colon cancer s/p colectomy, breast cancer s/p left mastectomy, hernia repair x2, diabetes, CKD, and sleep apnea who presents on transfer from [facility] for further management of N/V. She has been found to have acute pancreatitis, choledocholithiasis, pericholecystic fluid, and gallbladder wall thickening.    SUBJECTIVE: Denies Chest pain and SOB. Still having abdominal pain.   OBJECTIVE Filed Vitals:   12/07/15 2346 12/08/15 0700 12/08/15 0932 12/08/15 0942  BP:  148/64  126/56  Pulse: 79 82  80  Temp:  97.9 F (36.6 C)    TempSrc:  Oral    Resp: 18 18  20   Height:      Weight:      SpO2: 95% 100% 91% 100%    Intake/Output Summary (Last 24 hours) at 12/08/15 0946 Last data filed at 12/08/15 [id number]  Gross per 24 hour  Intake    550 ml  Output   1450 ml  Net   -900 ml   Filed Weights   12/02/15 0452 12/03/15 1341  Weight: 241 lb 13.5 oz (109.7 kg) 241 lb (109.317 kg)    PHYSICAL EXAM General: Well developed, well nourished,obese female in no acute distress. Head: Normocephalic, atraumatic.  Neck: Supple without bruits,No JVD. Lungs:  Resp regular and unlabored, CTA. Heart: RRR, S1,  S2, no S3, S4, or murmur; no rub. Abdomen: Soft, tender, non-distended, BS + x 4.  Extremities: No clubbing, cyanosis, no edema.  Neuro: Alert and oriented X 3. Moves all extremities spontaneously. Psych: Normal affect.  LABS: CBC: Recent Labs  12/06/15 1032 12/07/15 0511  WBC 10.8* 11.0*  HGB 11.2* 12.2  HCT 35.3* 38.8  MCV 90.1 90.0  PLT 164 [id number]   Basic Metabolic Panel: Recent Labs  12/07/15 0511 12/08/15 0340  NA 142 144  K 3.6 3.3*  CL 104 105  CO2 31 35*  GLUCOSE 136* 126*  BUN 9 7  CREATININE 1.13* 1.01*  CALCIUM 8.9 9.1   Liver Function Tests: Recent Labs  12/07/15 0511 12/08/15 0340  AST 44* 36  ALT 42 39  ALKPHOS 217* 204*  BILITOT 1.6* 1.2  PROT 5.4* 5.5*  ALBUMIN 2.1* 2.1*     Current facility-administered medications:  .  amiodarone (PACERONE) tablet 200 mg, 200 mg, Oral, Daily, Jared M Gardner, DO, 200 mg at 12/07/15 1009 .  antiseptic oral rinse (CPC / CETYLPYRIDINIUM CHLORIDE 0.05%) solution 7 mL, 7 mL, Mouth Rinse, BID, Jessica U Vann, DO, 7 mL at 12/07/15 2120 .  budesonide (PULMICORT) nebulizer solution 0.5 mg, 0.5 mg, Nebulization, Daily, Etta Quill, DO, 0.5 mg at 12/08/15 0932 .  insulin aspart (novoLOG) injection 0-15 Units, 0-15 Units, Subcutaneous, Q4H, Etta Quill, DO, 2 Units at  12/08/15 0835 .  montelukast (SINGULAIR) tablet 10 mg, 10 mg, Oral, QHS, Etta Quill, DO, 10 mg at 12/07/15 2119 .  morphine 2 MG/ML injection 2-4 mg, 2-4 mg, Intravenous, Q4H PRN, Etta Quill, DO, 4 mg at 12/02/15 2303 .  ondansetron (ZOFRAN) injection 4 mg, 4 mg, Intravenous, Q6H PRN, Etta Quill, DO, 4 mg at 12/07/15 0834 .  pantoprazole (PROTONIX) EC tablet 40 mg, 40 mg, Oral, Daily, Etta Quill, DO, 40 mg at 12/07/15 1010 .  PARoxetine (PAXIL) tablet 40 mg, 40 mg, Oral, Daily, Etta Quill, DO, 40 mg at 12/07/15 1009 .  piperacillin-tazobactam (ZOSYN) IVPB 3.375 g, 3.375 g, Intravenous, Q8H, Franky Macho, RPH, 3.375 g at  12/08/15 0505 .  potassium chloride (K-DUR,KLOR-CON) CR tablet 30 mEq, 30 mEq, Oral, Q4H, Albertine Patricia, MD, 30 mEq at 12/08/15 0836       Current Medications:  . amiodarone  200 mg Oral Daily  . antiseptic oral rinse  7 mL Mouth Rinse BID  . budesonide (PULMICORT) nebulizer solution  0.5 mg Nebulization Daily  . insulin aspart  0-15 Units Subcutaneous Q4H  . montelukast  10 mg Oral QHS  . pantoprazole  40 mg Oral Daily  . PARoxetine  40 mg Oral Daily  . piperacillin-tazobactam (ZOSYN)  IV  3.375 g Intravenous Q8H  . potassium chloride  30 mEq Oral Q4H      ASSESSMENT AND PLAN: Principal Problem:   Choledocholithiasis with acute cholecystitis with obstruction Active Problems:   OSA (obstructive sleep apnea)   Morbid obesity (HCC)   DM2 (diabetes mellitus, type 2) (HCC)   HTN (hypertension)   Leukocytosis   AKI (acute kidney injury) ([place])   Acute gallstone pancreatitis   Abdominal pain   Preop cardiovascular exam   Coronary artery disease involving coronary bypass graft of native heart without angina pectoris   LFT elevation   Coronary artery disease involving native coronary artery of native heart without angina pectoris  1. Preoperative evaluation for surgery:  According to records from Dr. Richrd Sox office, patient had low risk Lexiscan Myoview in July 2015. Per surgery she is still having abdominal pain, but labs have mostly normalized, still has elevated WBC. They will plan to treat with oral antibiotics. Will still need surgical intervention if pain is not tolerable.     Signed, Arbutus Leas , NP 9:46 AM 12/08/2015 Pager [phone]   I have examined the patient and reviewed assessment and plan and discussed with patient.  Agree with above as stated.   No further cardiac testing needed before surgery if medical therapy fails.  Negative stress test in 2015.  CABG in 2014.  No chest pain.  Will sign off.  Larae Grooms

## 2015-12-08 NOTE — Progress Notes (Signed)
Pharmacy Antibiotic Note  Lori Burnett is a 80 y.o. female admitted on 12/02/2015 with intra-abd infection. Transferred from [facility] for ERCP. Pharmacy consulted for Zosyn dosing - D#6. AF, wbc stable 11, CrCl~55. No cultures.  Plan: Zosyn 3.375gm IV q8h - 4h inf Monitor renal function and plans for length of therapy with IV antibiotics  Height: 5\' 3"  (160 cm) Weight: 241 lb (109.317 kg) IBW/kg (Calculated) : 52.4  Temp (24hrs), Avg:98 F (36.7 C), Min:97.9 F (36.6 C), Max:98.1 F (36.7 C)   Recent Labs Lab 12/02/15 0923 12/03/15 0649 12/04/15 0340 12/05/15 0247 12/06/15 1032 12/07/15 0511 12/08/15 0340  WBC 20.9* 12.9*  --  13.5* 10.8* 11.0*  --   CREATININE 2.63* 2.47* 1.98* 1.52*  --  1.13* 1.01*    Estimated Creatinine Clearance: 55.4 mL/min (by C-G formula based on Cr of 1.01).    No Known Allergies  Antimicrobials this admission: 6/15 Zosyn >>   Elicia Lamp, PharmD, [facility] Clinical Pharmacist Pager [phone] 12/08/2015 12:45 PM

## 2015-12-08 NOTE — Progress Notes (Signed)
Offered Pt. A bath and pt stated she did not need one because she had a bath last night

## 2015-12-08 NOTE — Progress Notes (Signed)
Physical Therapy Treatment Patient Details Name: Lori Burnett MRN: [id number] DOB: 03/13/1936 Today's Date: 12/08/2015    History of Present Illness pt presents with Cholecystitis with Obstruction, Pancreatits, and Gall Stones.  pt with hx of CAD, CABG with trach and prolonged vent, DM, Chronic Lung Disease, Colon CA, Breast CA s/p L Mastectomy, CKD, COPD on Home O2, HTN, CHF, Pulmonary HTN, PAD, Chronic Bronchitis, OSA, Sickle Cell Trait, Anxiety, and Depression.      PT Comments    Pt performed increased mobility this session.  Pt required encouragement to participate but pleasant and agreeable other wise.  Pt remains limited due to body habitus, decreased strength and decreased endurance.  HEP issued to promote activity at d/c.  Will continue per POC.    Follow Up Recommendations  Home health PT;Supervision - Intermittent     Equipment Recommendations   [phone] (rollator with seat) bariatric width.)    Recommendations for Other Services OT consult     Precautions / Restrictions Precautions Precautions: Fall Precaution Comments: Watch O2 Restrictions Weight Bearing Restrictions: No    Mobility  Bed Mobility Overal bed mobility: Needs Assistance Bed Mobility: Supine to Sit     Supine to sit: Mod assist     General bed mobility comments: Pt rrequired increased time but able to advance B LEs to edge of bed unassisted.  Pt required mod assist for trunk elevation.  Pt used PTA as a railing.    Transfers Overall transfer level: Needs assistance Equipment used: None Transfers: Sit to/from Stand Sit to Stand: Min guard         General transfer comment: Cues for hand and foot placement.  Required increased time with rocking momentum to complete transfer.    Ambulation/Gait Ambulation/Gait assistance: Min guard Ambulation Distance (Feet): 80 Feet Assistive device: None Gait Pattern/deviations: Step-through pattern;Wide base of support;Shuffle;Trunk flexed;Decreased stride  length Gait velocity: decreased Gait velocity interpretation: Below normal speed for age/gender General Gait Details: Pt required cues for endurance pacing, and upper trunk control.  Intermittent use of rail in hall for support.  DOE 2/4.  SPO2 3L 94%.  HR 97 bpm.     Stairs            Wheelchair Mobility    Modified Rankin (Stroke Patients Only)       Balance                                    Cognition Arousal/Alertness: Awake/alert Behavior During Therapy: WFL for tasks assessed/performed Overall Cognitive Status: Within Functional Limits for tasks assessed                      Exercises General Exercises - Lower Extremity Ankle Circles/Pumps: AROM;Both;20 reps;Seated (2x10 reps. ) Long Arc Quad: AROM;Both;20 reps;Seated (2x10 reps. ) Hip ABduction/ADduction: AROM;Both;10 reps;Seated (2x10 reps. ) Hip Flexion/Marching: AROM;Both;20 reps;Seated (2x10 reps. )    General Comments        Pertinent Vitals/Pain Pain Assessment: No/denies pain    Home Living                      Prior Function            PT Goals (current goals can now be found in the care plan section) Acute Rehab PT Goals Patient Stated Goal: "I need to get my strength back." Potential to Achieve Goals: Good  Frequency  Min 3X/week    PT Plan Discharge plan needs to be updated    Co-evaluation             End of Session Equipment Utilized During Treatment: Gait belt;Oxygen Activity Tolerance: Patient limited by fatigue Patient left: in chair;with call bell/phone within reach     Time: 1123-1151 PT Time Calculation (min) (ACUTE ONLY): 28 min  Charges:  $Gait Training: 8-22 mins $Therapeutic Exercise: 8-22 mins                    G Codes:      [name] 2015-12-20, 12:01 PM  [name], PTA pager [phone]

## 2015-12-08 NOTE — Progress Notes (Signed)
PROGRESS NOTE    Lori Burnett  [id number] DOB: 1935-11-14 DOA: 12/02/2015 PCP: Normand Sloop, MD   Outpatient Specialists:     Brief Narrative:  Lori Burnett is a 80 y.o. female with medical history significant of DM, HTN, HVOS, CAD. Patient presents to the ED at [facility] with c/o N/V, abdominal pain radiating to both flanks and back. Symptoms onset yesterday morning. Have worsened earlier in the evening which prompted her to go to [facility].  Work up in the ED at [facility] demonstrated acute pancreatitis with lipase > 30k, CT scan showed "mild acute pancreatitis", US gallbladder showed distended bile duct, cholecystitis, cholelithiasis, and clinical picture that was suspicious for choledocholithiasis.  Due to patient likely needing MRCP / ERCP, she was transferred to [facility].  Unable to have MRCP due to size, ERCP done and also had biliary sphinctertomy and balloon sweeping.  Deciding between conservative non-op management vs surgery.  Await record from primary cards in [place].   Assessment & Plan:   Principal Problem:   Choledocholithiasis with acute cholecystitis with obstruction Active Problems:   OSA (obstructive sleep apnea)   Morbid obesity (HCC)   DM2 (diabetes mellitus, type 2) (HCC)   HTN (hypertension)   Leukocytosis   AKI (acute kidney injury) ([place])   Acute gallstone pancreatitis   Abdominal pain   Preop cardiovascular exam   Coronary artery disease involving coronary bypass graft of native heart without angina pectoris   LFT elevation   Coronary artery disease involving native coronary artery of native heart without angina pectoris   Choledocholithiasis, with acute cholecystitis, obstruction, and acute gallstone pancreatitis  - ERCP as patient unable to fit in MRCP machine- done 6/16 Purulent cholangitis, small flecks of stonedebris were removed from the bile duct after biliary sphinctertomy and balloon sweeping. - Genral surgery consulted, surgical management versus  management , seen by cardiology , Patient is moderate risk for surgery  had CABG in 2014 and was not able to be extubated and spent > 1 month in [facility] with trach, discussed with Dr. Barry Dienes, plan is for medical management, can advance diet, she can be discharged total on 10 days of antibiotic treatment, and to follow with Dr. Linda Hedges as an outpatient.  AKI  - likely pre-renal in setting of pancreatitis and borderline low BPs, much improved - Holding ACE/duiretic for now  Hypernatremia -resolved with PO water intake  HTN  - holding BP meds  DM2  - Acceptable CBG , change sliding scale from 4 hours to 3 times a day before meals, will add low-dose Lantus 10 units subcutaneous daily (was on 17 units at home )  Morbid obesity - BMI 35 - Was very difficult to wean from vent post op from CABG in 2015, required trach and spending what looks like a month in the LTAC here weaning according to PCCM notes  OSA  - BIPAP per home settings at bed time  Chronic respiratory failure -wears 2.5L O2 at home  Hyperkalemia -resolved   DVT prophylaxis:  SCD's  Code Status: Full Code   Family Communication: None at bedside  Disposition Plan:     Consultants:   GI  General surgery  cardiology  Procedures:   ERCP     Subjective: Some nausea this AM  Objective: Filed Vitals:   12/08/15 0700 12/08/15 0932 12/08/15 0942 12/08/15 1436  BP: 148/64  126/56 143/85  Pulse: 82  80 71  Temp: 97.9 F (36.6 C)   98.2 F (36.8 C)  TempSrc: Oral  Oral  Resp: 18  20 19   Height:      Weight:      SpO2: 100% 91% 100% 100%    Intake/Output Summary (Last 24 hours) at 12/08/15 1644 Last data filed at 12/08/15 1635  Gross per 24 hour  Intake    690 ml  Output   1850 ml  Net  -1160 ml   Filed Weights   12/02/15 0452 12/03/15 1341  Weight: 109.7 kg (241 lb 13.5 oz) 109.317 kg (241 lb)    Examination:  General exam: Appears calm and comfortable -wears 2.5L O2 at  home Respiratory system: diminished, no wheezing Cardiovascular system: S1 & S2 heard, RRR. No JVD, murmurs, rubs, gallops or clicks. No pedal edema. Gastrointestinal system: Mild tenderness in epigastric area, obese, bowel sounds present. Extremities: No edema, clubbing or cyanosis     Data Reviewed: I have personally reviewed following labs and imaging studies  CBC:  Recent Labs Lab 12/03/15 0649 12/05/15 0247 12/06/15 1032 12/07/15 0511 12/08/15 1301  WBC 12.9* 13.5* 10.8* 11.0* 10.1  HGB 12.0 12.6 11.2* 12.2 12.8  HCT 37.5 39.2 35.3* 38.8 39.6  MCV 89.1 88.9 90.1 90.0 89.4  PLT 182 182 164 198 0000000   Basic Metabolic Panel:  Recent Labs Lab 12/03/15 0649 12/04/15 0340 12/05/15 0247 12/07/15 0511 12/08/15 0340  NA 144 143 148* 142 144  K 3.5 5.4* 3.7 3.6 3.3*  CL 110 109 110 104 105  CO2 25 25 28 31  35*  GLUCOSE 104* 107* 119* 136* 126*  BUN 24* 22* 13 9 7   CREATININE 2.47* 1.98* 1.52* 1.13* 1.01*  CALCIUM 8.4* 8.4* 9.0 8.9 9.1   GFR: Estimated Creatinine Clearance: 55.4 mL/min (by C-G formula based on Cr of 1.01). Liver Function Tests:  Recent Labs Lab 12/03/15 0649 12/04/15 0340 12/05/15 0247 12/07/15 0511 12/08/15 0340  AST 112* 117* 60* 44* 36  ALT 70* 64* 53 42 39  ALKPHOS 98 174* 206* 217* 204*  BILITOT 3.5* 5.1* 3.0* 1.6* 1.2  PROT 5.7* 5.3* 5.6* 5.4* 5.5*  ALBUMIN 2.4* 2.3* 2.1* 2.1* 2.1*    Recent Labs Lab 12/03/15 0649 12/04/15 0340  LIPASE 277* 91*   No results for input(s): AMMONIA in the last 168 hours. Coagulation Profile: No results for input(s): INR, PROTIME in the last 168 hours. Cardiac Enzymes: No results for input(s): CKTOTAL, CKMB, CKMBINDEX, TROPONINI in the last 168 hours. BNP (last 3 results) No results for input(s): PROBNP in the last 8760 hours. HbA1C: No results for input(s): HGBA1C in the last 72 hours. CBG:  Recent Labs Lab 12/08/15 0014 12/08/15 0417 12/08/15 0737 12/08/15 1340 12/08/15 1629  GLUCAP  145* 102* 147* 136* 140*   Lipid Profile: No results for input(s): CHOL, HDL, LDLCALC, TRIG, CHOLHDL, LDLDIRECT in the last 72 hours. Thyroid Function Tests: No results for input(s): TSH, T4TOTAL, FREET4, T3FREE, THYROIDAB in the last 72 hours. Anemia Panel: No results for input(s): VITAMINB12, FOLATE, FERRITIN, TIBC, IRON, RETICCTPCT in the last 72 hours. Urine analysis:    Component Value Date/Time   COLORURINE AMBER* 12/02/2015 1526   APPEARANCEUR TURBID* 12/02/2015 1526   LABSPEC 1.026 12/02/2015 1526   PHURINE 5.0 12/02/2015 1526   GLUCOSEU NEGATIVE 12/02/2015 1526   HGBUR NEGATIVE 12/02/2015 1526   BILIRUBINUR MODERATE* 12/02/2015 1526   KETONESUR 15* 12/02/2015 1526   PROTEINUR 30* 12/02/2015 1526   UROBILINOGEN 0.2 06/27/2013 0714   NITRITE NEGATIVE 12/02/2015 1526   LEUKOCYTESUR LARGE* 12/02/2015 1526     )No results  found for this or any previous visit (from the past 240 hour(s)).    Anti-infectives    Start     Dose/Rate Route Frequency Ordered Stop   12/02/15 1400  piperacillin-tazobactam (ZOSYN) IVPB 3.375 g     3.375 g 12.5 mL/hr over 240 Minutes Intravenous Every 8 hours 12/02/15 0548     12/02/15 0600  Ampicillin-Sulbactam (UNASYN) 3 g in sodium chloride 0.9 % 100 mL IVPB  Status:  Discontinued     3 g 100 mL/hr over 60 Minutes Intravenous Every 8 hours 12/02/15 0514 12/02/15 0534   12/02/15 0600  piperacillin-tazobactam (ZOSYN) IVPB 3.375 g     3.375 g 100 mL/hr over 30 Minutes Intravenous STAT 12/02/15 0548 12/02/15 0640       Radiology Studies: No results found.      Scheduled Meds: . amiodarone  200 mg Oral Daily  . antiseptic oral rinse  7 mL Mouth Rinse BID  . budesonide (PULMICORT) nebulizer solution  0.5 mg Nebulization Daily  . insulin aspart  0-15 Units Subcutaneous Q4H  . montelukast  10 mg Oral QHS  . pantoprazole  40 mg Oral Daily  . PARoxetine  40 mg Oral Daily  . piperacillin-tazobactam (ZOSYN)  IV  3.375 g Intravenous Q8H    Continuous Infusions:     LOS: 6 days    Time spent: 25 min    Oseias Horsey, MD [facility] Pager [phone]  If 7PM-7AM, please contact night-coverage [email or web address] Password [facility] 12/08/2015, 4:44 PM

## 2015-12-08 NOTE — Care Management Note (Addendum)
Case Management Note  Patient Details  Name: Lori Burnett MRN: [id number] Date of Birth: 03-Mar-1936  Subjective/Objective:                    Action/Plan:  Spoke with patient again at bedside , was able to reach daughter Joelene Millin  via phone , they have decided on [facility] , referral called and faxed to [facility] [phone] awaiting authorization.  [facility] [place] has accepted [facility] and [facility] orders. Will need discharge summary faxed [phone]  when completed.   Expected Discharge Date:                  Expected Discharge Plan:  [place]  In-House Referral:     Discharge planning Services  CM Consult  Post Acute Care Choice:  Durable Medical Equipment, Home Health Choice offered to:  Patient  DME Arranged:  [place] rolling DME Agency:  [place] Arranged:  RN, PT [facility] Agency:     Status of Service:  Completed  If discussed at [place] of Stay Meetings, dates discussed:    Additional Comments:  Marilu Favre, RN 12/08/2015, 10:47 AM

## 2015-12-08 NOTE — Progress Notes (Signed)
Patient ID: Lori Burnett, female   DOB: [birth year], 80 y.o.   MRN: [id number]   LOS: 6 days   Subjective: Still c/o abd pain in RLQ. Tolerating diet without problem.   Objective: Vital signs in last 24 hours: Temp:  [97.9 F (36.6 C)-98.1 F (36.7 C)] 97.9 F (36.6 C) (06/21 0700) Pulse Rate:  [78-82] 82 (06/21 0700) Resp:  [17-18] 18 (06/21 0700) BP: (146-154)/(63-81) 148/64 mmHg (06/21 0700) SpO2:  [94 %-100 %] 100 % (06/21 0700) Last BM Date: 12/07/15   Laboratory  CBC  Recent Labs  12/06/15 1032 12/07/15 0511  WBC 10.8* 11.0*  HGB 11.2* 12.2  HCT 35.3* 38.8  PLT 164 198   BMET  Recent Labs  12/07/15 0511 12/08/15 0340  NA 142 144  K 3.6 3.3*  CL 104 105  CO2 31 35*  GLUCOSE 136* 126*  BUN 9 7  CREATININE 1.13* 1.01*  CALCIUM 8.9 9.1   Hepatic Function Latest Ref Rng 12/08/2015 12/07/2015 12/05/2015  Total Protein 6.5 - 8.1 g/dL 5.5(L) 5.4(L) 5.6(L)  Albumin 3.5 - 5.0 g/dL 2.1(L) 2.1(L) 2.1(L)  AST 15 - 41 U/L 36 44(H) 60(H)  ALT 14 - 54 U/L 39 42 53  Alk Phosphatase 38 - 126 U/L 204(H) 217(H) 206(H)  Total Bilirubin 0.3 - 1.2 mg/dL 1.2 1.6(H) 3.0(H)    Physical Exam General appearance: alert and no distress Resp: clear to auscultation bilaterally Cardio: regular rate and rhythm GI: Soft, Mild TTP RUQ, RLQ, +BS   Assessment/Plan: Choledocholithiasis, pancreatitis, cholelithiasis (possible cholecystitis on CT) S/p ERCP sphincterotomy  Labs have mostly normalized, WBC remains mildly elevated, afebrile. --If pain is tolerable then could d/c on oral abx --If not, and willing to undergo surgery, then HIDA would be indicatied Continue with non operative management. Patient would prefer this route as well. Continue zosyn. Per cards moderate surgical risk candidate.   Surgery will sign off. Please call with questions.    Lisette Abu, PA-C Pager: [phone] 12/08/2015

## 2015-12-09 LAB — GLUCOSE, CAPILLARY
GLUCOSE-CAPILLARY: 128 mg/dL — AB (ref 65–99)
GLUCOSE-CAPILLARY: 126 mg/dL — AB (ref 65–99)
GLUCOSE-CAPILLARY: 200 mg/dL — AB (ref 65–99)
Glucose-Capillary: 210 mg/dL — ABNORMAL HIGH (ref 65–99)

## 2015-12-09 LAB — COMPREHENSIVE METABOLIC PANEL
ALK PHOS: 201 U/L — AB (ref 38–126)
ALT: 42 U/L (ref 14–54)
ANION GAP: 6 (ref 5–15)
AST: 40 U/L (ref 15–41)
Albumin: 2.2 g/dL — ABNORMAL LOW (ref 3.5–5.0)
BUN: 8 mg/dL (ref 6–20)
CALCIUM: 9.5 mg/dL (ref 8.9–10.3)
CO2: 34 mmol/L — AB (ref 22–32)
Chloride: 102 mmol/L (ref 101–111)
Creatinine, Ser: 1.04 mg/dL — ABNORMAL HIGH (ref 0.44–1.00)
GFR calc non Af Amer: 50 mL/min — ABNORMAL LOW (ref >60)
GFR, EST AFRICAN AMERICAN: 59 mL/min — AB (ref >60)
Glucose, Bld: 129 mg/dL — ABNORMAL HIGH (ref 65–99)
POTASSIUM: 3.6 mmol/L (ref 3.5–5.1)
SODIUM: 142 mmol/L (ref 135–145)
Total Bilirubin: 1.3 mg/dL — ABNORMAL HIGH (ref 0.3–1.2)
Total Protein: 6 g/dL — ABNORMAL LOW (ref 6.5–8.1)

## 2015-12-09 LAB — CBC
HCT: 38.8 % (ref 36.0–46.0)
HEMOGLOBIN: 12.7 g/dL (ref 12.0–15.0)
MCH: 28.7 pg (ref 26.0–34.0)
MCHC: 32.7 g/dL (ref 30.0–36.0)
MCV: 87.8 fL (ref 78.0–100.0)
Platelets: 201 10*3/uL (ref 150–400)
RBC: 4.42 MIL/uL (ref 3.87–5.11)
RDW: 15.2 % (ref 11.5–15.5)
WBC: 11 10*3/uL — ABNORMAL HIGH (ref 4.0–10.5)

## 2015-12-09 MED ORDER — HEPARIN SODIUM (PORCINE) 5000 UNIT/ML IJ SOLN
5000.0000 [IU] | Freq: Three times a day (TID) | INTRAMUSCULAR | Status: DC
  Administered 2015-12-09 – 2015-12-10 (×3): 5000 [IU] via SUBCUTANEOUS
  Filled 2015-12-09 (×2): qty 1

## 2015-12-09 MED ORDER — ENSURE ENLIVE PO LIQD
237.0000 mL | Freq: Three times a day (TID) | ORAL | Status: DC
  Administered 2015-12-09 – 2015-12-10 (×3): 237 mL via ORAL

## 2015-12-09 MED ORDER — WHITE PETROLATUM GEL
Status: AC
  Filled 2015-12-09: qty 1

## 2015-12-09 NOTE — Progress Notes (Signed)
Pt requesting to be placed back on CPAP after am neb tx. Pt back on previous settings. RT will continue to monitor.

## 2015-12-09 NOTE — Progress Notes (Signed)
PROGRESS NOTE    Lori Burnett  [id number] DOB: 03-Apr-1936 DOA: 12/02/2015 PCP: Normand Sloop, MD   Outpatient Specialists:     Brief Narrative:  Lori Burnett is a 80 y.o. female with medical history significant of DM, HTN, HVOS, CAD. Patient presents to the ED at [facility] with c/o N/V, abdominal pain radiating to both flanks and back. Symptoms onset yesterday morning. Have worsened earlier in the evening which prompted her to go to [facility].  Work up in the ED at [facility] demonstrated acute pancreatitis with lipase > 30k, CT scan showed "mild acute pancreatitis", US gallbladder showed distended bile duct, cholecystitis, cholelithiasis, and clinical picture that was suspicious for choledocholithiasis.  Due to patient likely needing MRCP / ERCP, she was transferred to [facility].  Unable to have MRCP due to size, ERCP done and also had biliary sphinctertomy and balloon sweeping.  Deciding between conservative non-op management vs surgery.  Await record from primary cards in [place].   Assessment & Plan:   Principal Problem:   Choledocholithiasis with acute cholecystitis with obstruction Active Problems:   OSA (obstructive sleep apnea)   Morbid obesity (HCC)   DM2 (diabetes mellitus, type 2) (HCC)   HTN (hypertension)   Leukocytosis   AKI (acute kidney injury) ([place])   Acute gallstone pancreatitis   Abdominal pain   Preop cardiovascular exam   Coronary artery disease involving coronary bypass graft of native heart without angina pectoris   LFT elevation   Coronary artery disease involving native coronary artery of native heart without angina pectoris   Choledocholithiasis, with acute cholecystitis, obstruction, and acute gallstone pancreatitis  - ERCP as patient unable to fit in MRCP machine- done 6/16 Purulent cholangitis, small flecks of stonedebris were removed from the bile duct after biliary sphinctertomy and balloon sweeping. - Genral surgery consulted, surgical management versus  medical management , seen by cardiology , Patient is moderate risk for surgery  had CABG in 2014 and was not able to be extubated and spent > [facility], discussed with Dr. Barry Dienes, plan is for medical management, and to follow with Dr. Linda Hedges as an outpatient. - To finish total of 10 days of antibiotic, can be discharged on by mouth Augmentin when stable - HEENT was advanced to low-fat diet yesterday, very poor oral intake, but she reports minimal pain, no further nausea, will observe over next 24 hours if appetite is improving, can be discharged in a.m.  AKI  - likely pre-renal in setting of pancreatitis and borderline low BPs, much improved - Holding ACE/duiretic for now  Hypernatremia -resolved with PO water intake  HTN  - holding BP meds  DM2  - Acceptable CBG , and tinea with insulin sliding scale and  Lantus 10 units subcutaneous daily (was on 70 units at home )  Morbid obesity - BMI 33 - Was very difficult to wean from vent post op from CABG in 2015, required trach and spending what looks like a month in the LTAC here weaning according to PCCM notes  OSA  - BIPAP per home settings at bed time  Chronic respiratory failure -wears 2.5L O2 at home  Hyperkalemia -resolved   DVT prophylaxis:  SCD's  Code Status: Full Code   Family Communication: None at bedside  Disposition Plan:     Consultants:   GI  General surgery  cardiology  Procedures:   ERCP     Subjective: Some nausea this AM  Objective: Filed Vitals:   12/09/15 [id number] 12/09/15 0810  12/09/15 0820 12/09/15 1255  BP: 156/55   162/75  Pulse: 81  81 80  Temp: 97.7 F (36.5 C)   97.6 F (36.4 C)  TempSrc: Oral   Oral  Resp: 19  16 19   Height:      Weight:      SpO2: 99% 98% 98% 94%    Intake/Output Summary (Last 24 hours) at 12/09/15 1539 Last data filed at 12/09/15 1350  Gross per 24 hour  Intake    920 ml  Output   1950 ml  Net  -1030 ml   Filed Weights    12/02/15 0452 12/03/15 1341  Weight: 109.7 kg (241 lb 13.5 oz) 109.317 kg (241 lb)    Examination:  General exam: Appears calm and comfortable -wears 2.5L O2 at home Respiratory system: diminished, no wheezing Cardiovascular system: S1 & S2 heard, RRR. No JVD, murmurs, rubs, gallops or clicks. No pedal edema. Gastrointestinal system: Mild tenderness in epigastric area almost resolved, obese, bowel sounds present. Extremities: No edema, clubbing or cyanosis     Data Reviewed: I have personally reviewed following labs and imaging studies  CBC:  Recent Labs Lab 12/05/15 0247 12/06/15 1032 12/07/15 0511 12/08/15 1301 12/09/15 1054  WBC 13.5* 10.8* 11.0* 10.1 11.0*  HGB 12.6 11.2* 12.2 12.8 12.7  HCT 39.2 35.3* 38.8 39.6 38.8  MCV 88.9 90.1 90.0 89.4 87.8  PLT 182 164 198 188 [id number]   Basic Metabolic Panel:  Recent Labs Lab 12/04/15 0340 12/05/15 0247 12/07/15 0511 12/08/15 0340 12/09/15 1054  NA 143 148* 142 144 142  K 5.4* 3.7 3.6 3.3* 3.6  CL 109 110 104 105 102  CO2 25 28 31  35* 34*  GLUCOSE 107* 119* 136* 126* 129*  BUN 22* 13 9 7 8   CREATININE 1.98* 1.52* 1.13* 1.01* 1.04*  CALCIUM 8.4* 9.0 8.9 9.1 9.5   GFR: Estimated Creatinine Clearance: 53.8 mL/min (by C-G formula based on Cr of 1.04). Liver Function Tests:  Recent Labs Lab 12/04/15 0340 12/05/15 0247 12/07/15 0511 12/08/15 0340 12/09/15 1054  AST 117* 60* 44* 36 40  ALT 64* 53 42 39 42  ALKPHOS 174* 206* 217* 204* 201*  BILITOT 5.1* 3.0* 1.6* 1.2 1.3*  PROT 5.3* 5.6* 5.4* 5.5* 6.0*  ALBUMIN 2.3* 2.1* 2.1* 2.1* 2.2*    Recent Labs Lab 12/03/15 0649 12/04/15 0340  LIPASE 277* 91*   No results for input(s): AMMONIA in the last 168 hours. Coagulation Profile: No results for input(s): INR, PROTIME in the last 168 hours. Cardiac Enzymes: No results for input(s): CKTOTAL, CKMB, CKMBINDEX, TROPONINI in the last 168 hours. BNP (last 3 results) No results for input(s): PROBNP in the last 8760  hours. HbA1C: No results for input(s): HGBA1C in the last 72 hours. CBG:  Recent Labs Lab 12/08/15 1340 12/08/15 1629 12/08/15 2002 12/09/15 0720 12/09/15 1142  GLUCAP 136* 140* 159* 128* 126*   Lipid Profile: No results for input(s): CHOL, HDL, LDLCALC, TRIG, CHOLHDL, LDLDIRECT in the last 72 hours. Thyroid Function Tests: No results for input(s): TSH, T4TOTAL, FREET4, T3FREE, THYROIDAB in the last 72 hours. Anemia Panel: No results for input(s): VITAMINB12, FOLATE, FERRITIN, TIBC, IRON, RETICCTPCT in the last 72 hours. Urine analysis:    Component Value Date/Time   COLORURINE AMBER* 12/02/2015 1526   APPEARANCEUR TURBID* 12/02/2015 1526   LABSPEC 1.026 12/02/2015 1526   PHURINE 5.0 12/02/2015 1526   GLUCOSEU NEGATIVE 12/02/2015 1526   HGBUR NEGATIVE 12/02/2015 1526   BILIRUBINUR MODERATE* 12/02/2015 1526  KETONESUR 15* 12/02/2015 1526   PROTEINUR 30* 12/02/2015 1526   UROBILINOGEN 0.2 06/27/2013 0714   NITRITE NEGATIVE 12/02/2015 1526   LEUKOCYTESUR LARGE* 12/02/2015 1526     )No results found for this or any previous visit (from the past 240 hour(s)).    Anti-infectives    Start     Dose/Rate Route Frequency Ordered Stop   12/02/15 1400  piperacillin-tazobactam (ZOSYN) IVPB 3.375 g     3.375 g 12.5 mL/hr over 240 Minutes Intravenous Every 8 hours 12/02/15 0548     12/02/15 0600  Ampicillin-Sulbactam (UNASYN) 3 g in sodium chloride 0.9 % 100 mL IVPB  Status:  Discontinued     3 g 100 mL/hr over 60 Minutes Intravenous Every 8 hours 12/02/15 0514 12/02/15 0534   12/02/15 0600  piperacillin-tazobactam (ZOSYN) IVPB 3.375 g     3.375 g 100 mL/hr over 30 Minutes Intravenous STAT 12/02/15 0548 12/02/15 0640       Radiology Studies: No results found.      Scheduled Meds: . amiodarone  200 mg Oral Daily  . antiseptic oral rinse  7 mL Mouth Rinse BID  . budesonide (PULMICORT) nebulizer solution  0.5 mg Nebulization Daily  . feeding supplement (ENSURE  ENLIVE)  237 mL Oral TID BM  . insulin aspart  0-15 Units Subcutaneous TID WC  . insulin glargine  10 Units Subcutaneous Daily  . montelukast  10 mg Oral QHS  . pantoprazole  40 mg Oral Daily  . PARoxetine  40 mg Oral Daily  . piperacillin-tazobactam (ZOSYN)  IV  3.375 g Intravenous Q8H  . white petrolatum       Continuous Infusions:     LOS: 7 days    Time spent: 20 min    ELGERGAWY, DAWOOD, MD [facility] Pager [phone]  If 7PM-7AM, please contact night-coverage [email or web address] Password [facility] 12/09/2015, 3:39 PM

## 2015-12-09 NOTE — Care Management Important Message (Signed)
Important Message  Patient Details  Name: Lori Burnett MRN: [id number] Date of Birth: 06/27/1935   Medicare Important Message Given:  Yes    Marilu Favre, RN 12/09/2015, 10:36 AM

## 2015-12-09 NOTE — Progress Notes (Signed)
Pt taken off CPAP, placed on 2LNC. Pt tolerating well at this time. RT will continue to monitor.

## 2015-12-10 LAB — GLUCOSE, CAPILLARY
GLUCOSE-CAPILLARY: 208 mg/dL — AB (ref 65–99)
Glucose-Capillary: 146 mg/dL — ABNORMAL HIGH (ref 65–99)
Glucose-Capillary: 232 mg/dL — ABNORMAL HIGH (ref 65–99)

## 2015-12-10 MED ORDER — AMOXICILLIN-POT CLAVULANATE 875-125 MG PO TABS: 1.0000 | ORAL_TABLET | Freq: Two times a day (BID) | ORAL | Status: AC

## 2015-12-10 MED ORDER — INSULIN GLARGINE 100 UNIT/ML ~~LOC~~ SOLN: 18.0000 [IU] | Freq: Every day | SUBCUTANEOUS | Status: AC

## 2015-12-10 MED ORDER — ENSURE ENLIVE PO LIQD
237.0000 mL | Freq: Three times a day (TID) | ORAL | Status: AC
Start: 2015-12-10 — End: ?

## 2015-12-10 MED ORDER — SACCHAROMYCES BOULARDII 250 MG PO CAPS: 250.0000 mg | ORAL_CAPSULE | Freq: Two times a day (BID) | ORAL | Status: AC

## 2015-12-10 MED ORDER — HYDROCODONE-ACETAMINOPHEN 5-325 MG PO TABS: 1.0000 | ORAL_TABLET | Freq: Four times a day (QID) | ORAL | Status: AC | PRN

## 2015-12-10 NOTE — Discharge Instructions (Signed)
Follow with Primary MD Normand Sloop, MD in 7 days   Get CBC, CMP,  checked  by Primary MD next visit.    Activity: As tolerated with Full fall precautions use walker/cane & assistance as needed   Disposition Home    Diet: Heart Healthy low-fat , with feeding assistance and aspiration precautions.  For Heart failure patients - Check your Weight same time everyday, if you gain over 2 pounds, or you develop in leg swelling, experience more shortness of breath or chest pain, call your Primary MD immediately. Follow Cardiac Low Salt Diet and 1.5 lit/day fluid restriction.   On your next visit with your primary care physician please Get Medicines reviewed and adjusted.   Please request your Prim.MD to go over all Hospital Tests and Procedure/Radiological results at the follow up, please get all Hospital records sent to your Prim MD by signing hospital release before you go home.   If you experience worsening of your admission symptoms, develop shortness of breath, life threatening emergency, suicidal or homicidal thoughts you must seek medical attention immediately by calling 911 or calling your MD immediately  if symptoms less severe.  You Must read complete instructions/literature along with all the possible adverse reactions/side effects for all the Medicines you take and that have been prescribed to you. Take any new Medicines after you have completely understood and accpet all the possible adverse reactions/side effects.   Do not drive, operating heavy machinery, perform activities at heights, swimming or participation in water activities or provide baby sitting services if your were admitted for syncope or siezures until you have seen by Primary MD or a Neurologist and advised to do so again.  Do not drive when taking Pain medications.    Do not take more than prescribed Pain, Sleep and Anxiety Medications  Special Instructions: If you have smoked or chewed Tobacco  in the last 2  yrs please stop smoking, stop any regular Alcohol  and or any Recreational drug use.  Wear Seat belts while driving.   Please note  You were cared for by a hospitalist during your hospital stay. If you have any questions about your discharge medications or the care you received while you were in the hospital after you are discharged, you can call the unit and asked to speak with the hospitalist on call if the hospitalist that took care of you is not available. Once you are discharged, your primary care physician will handle any further medical issues. Please note that NO REFILLS for any discharge medications will be authorized once you are discharged, as it is imperative that you return to your primary care physician (or establish a relationship with a primary care physician if you do not have one) for your aftercare needs so that they can reassess your need for medications and monitor your lab values.

## 2015-12-10 NOTE — Progress Notes (Signed)
Physical Therapy Treatment Patient Details Name: Lori Burnett MRN: [id number] DOB: 1935/12/28 Today's Date: 12/10/2015    History of Present Illness pt presents with Cholecystitis with Obstruction, Pancreatits, and Gall Stones.  pt with hx of CAD, CABG with trach and prolonged vent, DM, Chronic Lung Disease, Colon CA, Breast CA s/p L Mastectomy, CKD, COPD on Home O2, HTN, CHF, Pulmonary HTN, PAD, Chronic Bronchitis, OSA, Sickle Cell Trait, Anxiety, and Depression.      PT Comments    Pt ambulating with min/guard to S.  Pt able to use toilet and do hygiene without assistance.  Con't to recommend HHPT.  Follow Up Recommendations  Home health PT;Supervision - Intermittent     Equipment Recommendations  Other (comment) [id number] bariatric walker with seat (bariatric))    Recommendations for Other Services       Precautions / Restrictions Precautions Precautions: Fall Precaution Comments: Watch O2 Restrictions Weight Bearing Restrictions: No    Mobility  Bed Mobility               General bed mobility comments: sitting up in recliner upon arrival  Transfers Overall transfer level: Needs assistance Equipment used: None Transfers: Sit to/from Stand Sit to Stand: Min guard         General transfer comment: uses momentum to get up from recliner and grab bar next to toilet  Ambulation/Gait Ambulation/Gait assistance: Min guard;Supervision Ambulation Distance (Feet): 95 Feet Assistive device: None Gait Pattern/deviations: Step-through pattern;Wide base of support;Decreased step length - right;Decreased step length - left Gait velocity: decreased Gait velocity interpretation: Below normal speed for age/gender General Gait Details: Pt able to ambulate without rest break after ambulating into bathroom first. O2 on 3 L/min and 2/4 dyspnea.       Stairs            Wheelchair Mobility    Modified Rankin (Stroke Patients Only)       Balance     Sitting  balance-Leahy Scale: Good       Standing balance-Leahy Scale: Good                      Cognition Arousal/Alertness: Awake/alert Behavior During Therapy: WFL for tasks assessed/performed Overall Cognitive Status: Within Functional Limits for tasks assessed                      Exercises      General Comments        Pertinent Vitals/Pain Pain Assessment: No/denies pain    Home Living                      Prior Function            PT Goals (current goals can now be found in the care plan section) Acute Rehab PT Goals PT Goal Formulation: With patient Time For Goal Achievement: 12/20/15 Potential to Achieve Goals: Good Progress towards PT goals: Progressing toward goals    Frequency  Min 3X/week    PT Plan Current plan remains appropriate    Co-evaluation             End of Session Equipment Utilized During Treatment: Gait belt;Oxygen Activity Tolerance: Patient tolerated treatment well Patient left: in chair;with call bell/phone within reach;Other (comment) (with lunch set up)     Time: [id number] PT Time Calculation (min) (ACUTE ONLY): 18 min  Charges:  $Gait Training: 8-22 mins  G Codes:      Joslin Doell LUBECK 12/10/2015, 1:19 PM

## 2015-12-10 NOTE — Discharge Summary (Signed)
Lori Burnett, is a 80 y.o. female  DOB Aug 11, 1935  MRN [id number].  Admission date:  12/02/2015  Admitting Physician  Etta Quill, DO  Discharge Date:  12/10/2015   Primary MD  Normand Sloop, MD  Recommendations for primary care physician for things to follow:  - Patient to follow with surgery as an outpatient - Please check CBC, CMP during next visit   Admission Diagnosis  PANCREATITIS gallstone pancreatitis, choledocholithiasis   Discharge Diagnosis  PANCREATITIS gallstone pancreatitis, choledocholithiasis    Principal Problem:   Choledocholithiasis with acute cholecystitis with obstruction Active Problems:   OSA (obstructive sleep apnea)   Morbid obesity ([place])   DM2 (diabetes mellitus, type 2) (HCC)   HTN (hypertension)   Leukocytosis   AKI (acute kidney injury) ([place])   Acute gallstone pancreatitis   Abdominal pain   Preop cardiovascular exam   Coronary artery disease involving coronary bypass graft of native heart without angina pectoris   LFT elevation   Coronary artery disease involving native coronary artery of native heart without angina pectoris      Past Medical History  Diagnosis Date  . COPD (chronic obstructive pulmonary disease) ([place])   . Coronary artery disease   . Hypertension   . CHF (congestive heart failure) ([place])   . Pulmonary hypertension ([place])   . PAD (peripheral artery disease) ([place])   . Complication of anesthesia     "problems coming off it when she had her heart OR; had to later get a trach and go to [facility]"  . High cholesterol   . Asthma   . On home oxygen therapy     "2.5L; 24/7" (12/02/2015)  . Pneumonia 06/2013  . Chronic bronchitis ([place])   . OSA treated with BiPAP   . Type II diabetes mellitus ([place])   . Sickle cell trait ([place])   . History of blood transfusion     "related to colon and heart ORs"  . GERD (gastroesophageal reflux disease)   .  Arthritis     "knees" (12/02/2015)  . Anxiety 06/2013  . Depression   . Chronic kidney disease (CKD), stage III (moderate)   . Cancer of left breast ([place])   . Colon cancer ([place])   . CAD (coronary artery disease)     Past Surgical History  Procedure Laterality Date  . Tracheostomy tube placement N/A 07/03/2013    Procedure: TRACHEOSTOMY;  Surgeon: Rozetta Nunnery, MD;  Location: [facility] OR;  Service: ENT;  Laterality: N/A;  . Esophagogastroduodenoscopy N/A 07/07/2013    Procedure: ESOPHAGOGASTRODUODENOSCOPY (EGD);  Surgeon: Lafayette Dragon, MD;  Location: [facility] ENDOSCOPY;  Service: Endoscopy;  Laterality: N/A;  . Abdominal hysterectomy    . Umbilical hernia repair    . Hernia repair    . Breast biopsy Left   . Mastectomy Left ~ 2014  . Colectomy  ~ 2005  . Coronary artery bypass graft  05/2013    "triple"  . Coronary angioplasty with stent placement  05/2013  . Ercp N/A 12/03/2015  Procedure: ENDOSCOPIC RETROGRADE CHOLANGIOPANCREATOGRAPHY (ERCP);  Surgeon: Milus Banister, MD;  Location: [place];  Service: Endoscopy;  Laterality: N/A;       History of present illness and  Hospital Course:     Kindly see H&P for history of present illness and admission details, please review complete Labs, Consult reports and Test reports for all details in brief  HPI  from the history and physical done on the day of admission 12/02/2015 HPI: Lori Burnett is a 80 y.o. female with medical history significant of DM, HTN, HVOS, CAD. Patient presents to the ED at [facility] with c/o N/V, abdominal pain radiating to both flanks and back. Symptoms onset yesterday morning. Have worsened earlier in the evening which prompted her to go to [facility].  ED Course: Work up in the ED at [facility] demonstrated acute pancreatitis with lipase > 30k, CT scan showed "mild acute pancreatitis", US gallbladder showed distended bile duct, cholecystitis, cholelithiasis, and clinical picture that was suspicious for  choledocholithiasis. She also has AKI with creatinine of 2.1, leukocytosis with WBC of 20k. Patient given 1L bolus IVF and Zosyn. Due to patient likely needing MRCP / ERCP, she was transferred to [facility].   Hospital Course  Lori Burnett is a 80 y.o. female with medical history significant of DM, HTN, HVOS, CAD. Patient presents to the ED at [facility] with c/o N/V, abdominal pain radiating to both flanks and back. Symptoms onset yesterday morning. Have worsened earlier in the evening which prompted her to go to [facility]. Work up in the ED at [facility] demonstrated acute pancreatitis with lipase > 30k, CT scan showed "mild acute pancreatitis", US gallbladder showed distended bile duct, cholecystitis, cholelithiasis, and clinical picture that was suspicious for choledocholithiasis.  Due to patient likely needing MRCP / ERCP, she was transferred to [facility]. Unable to have MRCP due to size, ERCP done and also had biliary sphinctertomy and balloon sweeping. Seen by surgery ,Deciding between conservative non-op management vs surgery. He should continue to improve with conservative management.   Choledocholithiasis, with acute cholecystitis, obstruction, and acute gallstone pancreatitis  - ERCP as patient unable to fit in MRCP machine- done 6/16 Purulent cholangitis, small flecks of stonedebris were removed from the bile duct after biliary sphinctertomy and balloon sweeping. - Genral surgery consulted, surgical management versus medical management , seen by cardiology , Patient is moderate risk for surgery had CABG in 2014 and was not able to be extubated and spent > 1 [facility] in [facility] with [facility], discussed with Dr. Barry Dienes, plan is for medical management, and to follow with Dr. Linda Hedges as an outpatient. - To finish total of 10 days of antibiotic, treated with 8 days of IV Zosyn during hospital stay, to finish another 2 days of oral Augmentin as an outpatient - \was advanced to low-fat diet is tolerating over the  last 48 hours, pain is minimal and continues to improve, no nausea, good by mouth intake.  HTN  Resume home meds  DM2  - Acceptable CBG , patient was on 70 units of Lantus at home, her CBG is controlled on insulin sliding scale and low dose Lantus, we will discharge on 18 units of Lantus, and if CBG uncontrolled, will need to increase gradually, I think her CBGs are controlled during hospital stay secondary to compliance with diet.  Morbid obesity - BMI 40 - Was very difficult to wean from vent post op from CABG in 2015, required trach and spending what looks like a month in the LTAC here  weaning according to PCCM notes  OSA  - BIPAP per home settings at bed time  Chronic respiratory failure -wears 2.5L O2 at home  Hyperkalemia -resolved  Discharge Condition: stable   Follow UP  Follow-up Information    Follow up with Normand Sloop, MD. Schedule an appointment as soon as possible for a visit in 1 week.   Specialty:  Family Medicine   Contact information:   [place] [address] [phone]       Follow up with Judeth Horn, MD. Schedule an appointment as soon as possible for a visit in 2 weeks.   Specialty:  General Surgery   Contact information:   [place] [address] [phone]         Discharge Instructions  and  Discharge Medications    Discharge Instructions    Discharge instructions    Complete by:  As directed   Follow with Primary MD Normand Sloop, MD in 7 days   Get CBC, CMP,  checked  by Primary MD next visit.    Activity: As tolerated with Full fall precautions use walker/cane & assistance as needed   Disposition Home    Diet: Heart Healthy low-fat , with feeding assistance and aspiration precautions.  For Heart failure patients - Check your Weight same time everyday, if you gain over 2 pounds, or you develop in leg swelling, experience more shortness of breath or chest pain, call your Primary MD  immediately. Follow Cardiac Low Salt Diet and 1.5 lit/day fluid restriction.   On your next visit with your primary care physician please Get Medicines reviewed and adjusted.   Please request your Prim.MD to go over all Hospital Tests and Procedure/Radiological results at the follow up, please get all Hospital records sent to your Prim MD by signing hospital release before you go home.   If you experience worsening of your admission symptoms, develop shortness of breath, life threatening emergency, suicidal or homicidal thoughts you must seek medical attention immediately by calling 911 or calling your MD immediately  if symptoms less severe.  You Must read complete instructions/literature along with all the possible adverse reactions/side effects for all the Medicines you take and that have been prescribed to you. Take any new Medicines after you have completely understood and accpet all the possible adverse reactions/side effects.   Do not drive, operating heavy machinery, perform activities at heights, swimming or participation in water activities or provide baby sitting services if your were admitted for syncope or siezures until you have seen by Primary MD or a Neurologist and advised to do so again.  Do not drive when taking Pain medications.    Do not take more than prescribed Pain, Sleep and Anxiety Medications  Special Instructions: If you have smoked or chewed Tobacco  in the last 2 yrs please stop smoking, stop any regular Alcohol  and or any Recreational drug use.  Wear Seat belts while driving.   Please note  You were cared for by a hospitalist during your hospital stay. If you have any questions about your discharge medications or the care you received while you were in the hospital after you are discharged, you can call the unit and asked to speak with the hospitalist on call if the hospitalist that took care of you is not available. Once you are discharged, your primary  care physician will handle any further medical issues. Please note that NO REFILLS for any discharge medications will  be authorized once you are discharged, as it is imperative that you return to your primary care physician (or establish a relationship with a primary care physician if you do not have one) for your aftercare needs so that they can reassess your need for medications and monitor your lab values.     Increase activity slowly    Complete by:  As directed             Medication List    STOP taking these medications        atorvastatin 20 MG tablet  Commonly known as:  LIPITOR     ferrous sulfate 325 (65 FE) MG tablet     pantoprazole 40 MG tablet  Commonly known as:  PROTONIX     potassium chloride SA 20 MEQ tablet  Commonly known as:  K-DUR,KLOR-CON      TAKE these medications        amiodarone 200 MG tablet  Commonly known as:  PACERONE  Take 200 mg by mouth daily.     amoxicillin-clavulanate 875-125 MG tablet  Commonly known as:  AUGMENTIN  Take 1 tablet by mouth 2 (two) times daily.     aspirin 81 MG tablet  Take 81 mg by mouth daily.     budesonide 0.5 MG/2ML nebulizer solution  Commonly known as:  PULMICORT  Take 0.5 mg by nebulization 2 (two) times daily.     Choline Fenofibrate 135 MG capsule  Take 135 mg by mouth daily.     docusate sodium 50 MG capsule  Commonly known as:  COLACE  Take 50 mg by mouth 2 (two) times daily as needed for mild constipation.     feeding supplement (ENSURE ENLIVE) Liqd  Take 237 mLs by mouth 3 (three) times daily between meals.     fluticasone 50 MCG/ACT nasal spray  Commonly known as:  FLONASE  Place into both nostrils daily.     folic acid 1 MG tablet  Commonly known as:  FOLVITE  Take 1 mg by mouth daily.     furosemide 40 MG tablet  Commonly known as:  LASIX  Take 40 mg by mouth daily.     HYDROcodone-acetaminophen 5-325 MG tablet  Commonly known as:  NORCO/VICODIN  Take 1 tablet by mouth every 6  (six) hours as needed for moderate pain.     insulin aspart 100 UNIT/ML injection  Commonly known as:  novoLOG  Inject 0-3 Units into the skin See admin instructions. Patient uses sliding scale     insulin glargine 100 UNIT/ML injection  Commonly known as:  LANTUS  Inject 0.18 mLs (18 Units total) into the skin daily.     lisinopril 5 MG tablet  Commonly known as:  PRINIVIL,ZESTRIL  Take 5 mg by mouth daily.     loratadine 10 MG tablet  Commonly known as:  CLARITIN  Take 10 mg by mouth daily.     montelukast 10 MG tablet  Commonly known as:  SINGULAIR  Take 10 mg by mouth at bedtime.     multivitamin with minerals tablet  Take 1 tablet by mouth daily.     nitroGLYCERIN 0.4 MG SL tablet  Commonly known as:  NITROSTAT  Place 0.4 mg under the tongue every 5 (five) minutes as needed for chest pain.     omeprazole 20 MG capsule  Commonly known as:  PRILOSEC  Take 20 mg by mouth daily.     PARoxetine 40 MG tablet  Commonly known as:  PAXIL  Take 40 mg by mouth every morning.     saccharomyces boulardii 250 MG capsule  Commonly known as:  FLORASTOR  Take 1 capsule (250 mg total) by mouth 2 (two) times daily.          Diet and Activity recommendation: See Discharge Instructions above   Consults obtained -   GI  General surgery  cardiology   Major procedures and Radiology Reports - PLEASE review detailed and final reports for all details, in brief -  ERCP   Dg Chest 2 View  12/04/2015  CLINICAL DATA:  Preop cardiovascular exam 07/30/2013 EXAM: CHEST  2 VIEW COMPARISON:  None. FINDINGS: Internal jugular central line tip projects over the superior vena cava. Stable cardiac enlargement. There is vascular congestion with no definite pulmonary edema. Mild blunting left costophrenic angle may indicate small pleural effusion versus chronic pleural thickening. Study is limited by body habitus. IMPRESSION: Study limited by body habitus. Stable cardiac enlargement with  vascular congestion. Mild left pleural thickening versus tiny left effusion. Electronically Signed   By: Skipper Cliche M.D.   On: 12/04/2015 17:09   Dg Ercp Biliary & Pancreatic Ducts  12/03/2015  CLINICAL DATA:  ERCP EXAM: ERCP TECHNIQUE: Multiple spot images obtained with the fluoroscopic device and submitted for interpretation post-procedure. FLUOROSCOPY TIME:  Radiation Exposure Index (as provided by the fluoroscopic device): If the device does not provide the exposure index: Fluoroscopy Time:  3 minutes and 8 seconds Number of Acquired Images:  For images COMPARISON:  None. FINDINGS: Contrast fills the common bile duct. A balloon has been utilized to sweep the common bile duct. IMPRESSION: See above. These images were submitted for radiologic interpretation only. Please see the procedural report for the amount of contrast and the fluoroscopy time utilized. Electronically Signed   By: Marybelle Killings M.D.   On: 12/03/2015 16:48    Micro Results    No results found for this or any previous visit (from the past 240 hour(s)).     Today   Subjective:   Tess Beighley today has no headache,no chest or abdominal pain, feels much better wants to go home today.   Objective:   Blood pressure 160/74, pulse 95, temperature 97.6 F (36.4 C), temperature source Axillary, resp. rate 19, height 5\' 3"  (1.6 m), weight 109.317 kg (241 lb), SpO2 98 %.   Intake/Output Summary (Last 24 hours) at 12/10/15 1254 Last data filed at 12/10/15 0846  Gross per 24 hour  Intake    700 ml  Output   1100 ml  Net   -400 ml    Exam Awake Alert, Oriented x 3 Supple Neck,No JVD Symmetrical Chest wall movement, Good air movement bilaterally, CTAB RRR,No Gallops,Rubs or new Murmurs, No Parasternal Heave +ve B.Sounds, Abd Soft, Non tender,No rebound -guarding or rigidity. No Cyanosis, Clubbing or edema, No new Rash or bruise  Data Review   CBC w Diff: Lab Results  Component Value Date   WBC 11.0* 12/09/2015     HGB 12.7 12/09/2015   HCT 38.8 12/09/2015   PLT 201 12/09/2015   LYMPHOPCT 12 07/14/2013   MONOPCT 12 07/14/2013   EOSPCT 6* 07/14/2013   BASOPCT 0 07/14/2013    CMP: Lab Results  Component Value Date   NA 142 12/09/2015   K 3.6 12/09/2015   CL 102 12/09/2015   CO2 34* 12/09/2015   BUN 8 12/09/2015   CREATININE 1.04* 12/09/2015   PROT 6.0* 12/09/2015   ALBUMIN 2.2* 12/09/2015  BILITOT 1.3* 12/09/2015   ALKPHOS 201* 12/09/2015   AST 40 12/09/2015   ALT 42 12/09/2015  .   Total Time in preparing paper work, data evaluation and todays exam - 59 minutes  ELGERGAWY, DAWOOD M.D on 12/10/2015 at 12:54 PM  [facility]   Office  [phone]

## 2015-12-10 NOTE — Care Management (Signed)
Discharge summary faxed to [facility] at [facility] . Dianne aware discharge is today . Patient states she has portable oxygen tank for home. Magdalen Spatz RN BSN [phone]

## 2015-12-10 NOTE — Progress Notes (Signed)
[place] signing off - please re-consult if needed.  [place], Pharm.D., BCPS Clinical Pharmacist Pager: [phone] 12/10/2015 11:21 AM

## 2016-01-07 ENCOUNTER — Other Ambulatory Visit (HOSPITAL_COMMUNITY): Payer: Self-pay | Admitting: General Surgery

## 2016-01-07 DIAGNOSIS — K8050 Calculus of bile duct without cholangitis or cholecystitis without obstruction: Secondary | ICD-10-CM

## 2016-01-12 ENCOUNTER — Encounter (HOSPITAL_COMMUNITY): Payer: Medicare Other

## 2017-05-19 DEATH — deceased
